# Patient Record
Sex: Male | Born: 1965 | Race: Black or African American | Hispanic: No | Marital: Married | Smoking: Never smoker
Health system: Southern US, Community
[De-identification: ages and names within clinical notes are randomized; demographics above are authoritative.]

## PROBLEM LIST (undated history)

## (undated) DIAGNOSIS — E119 Type 2 diabetes mellitus without complications: Secondary | ICD-10-CM

## (undated) DIAGNOSIS — I10 Essential (primary) hypertension: Secondary | ICD-10-CM

## (undated) HISTORY — PX: VENTRAL HERNIA REPAIR: SHX424

---

## 2016-08-03 ENCOUNTER — Ambulatory Visit: Payer: Self-pay

## 2016-08-03 ENCOUNTER — Other Ambulatory Visit: Payer: Self-pay | Admitting: Occupational Medicine

## 2016-08-03 DIAGNOSIS — Z0000 Encounter for general adult medical examination without abnormal findings: Secondary | ICD-10-CM

## 2017-04-12 ENCOUNTER — Emergency Department (HOSPITAL_BASED_OUTPATIENT_CLINIC_OR_DEPARTMENT_OTHER): Payer: BLUE CROSS/BLUE SHIELD

## 2017-04-12 ENCOUNTER — Encounter (HOSPITAL_BASED_OUTPATIENT_CLINIC_OR_DEPARTMENT_OTHER): Payer: Self-pay | Admitting: *Deleted

## 2017-04-12 ENCOUNTER — Emergency Department (HOSPITAL_BASED_OUTPATIENT_CLINIC_OR_DEPARTMENT_OTHER)
Admission: EM | Admit: 2017-04-12 | Discharge: 2017-04-12 | Disposition: A | Discharge status: Home / Self Care | Payer: BLUE CROSS/BLUE SHIELD | Attending: Emergency Medicine | Admitting: Emergency Medicine

## 2017-04-12 ENCOUNTER — Other Ambulatory Visit: Payer: Self-pay

## 2017-04-12 DIAGNOSIS — Y9389 Activity, other specified: Secondary | ICD-10-CM | POA: Insufficient documentation

## 2017-04-12 DIAGNOSIS — I10 Essential (primary) hypertension: Secondary | ICD-10-CM | POA: Insufficient documentation

## 2017-04-12 DIAGNOSIS — S61402A Unspecified open wound of left hand, initial encounter: Secondary | ICD-10-CM | POA: Diagnosis not present

## 2017-04-12 DIAGNOSIS — Y92009 Unspecified place in unspecified non-institutional (private) residence as the place of occurrence of the external cause: Secondary | ICD-10-CM | POA: Diagnosis not present

## 2017-04-12 DIAGNOSIS — E119 Type 2 diabetes mellitus without complications: Secondary | ICD-10-CM | POA: Diagnosis not present

## 2017-04-12 DIAGNOSIS — W3400XA Accidental discharge from unspecified firearms or gun, initial encounter: Secondary | ICD-10-CM | POA: Diagnosis not present

## 2017-04-12 DIAGNOSIS — Y999 Unspecified external cause status: Secondary | ICD-10-CM | POA: Insufficient documentation

## 2017-04-12 DIAGNOSIS — S61412A Laceration without foreign body of left hand, initial encounter: Secondary | ICD-10-CM

## 2017-04-12 HISTORY — DX: Essential (primary) hypertension: I10

## 2017-04-12 HISTORY — DX: Type 2 diabetes mellitus without complications: E11.9

## 2017-04-12 MED ORDER — MORPHINE SULFATE (PF) 4 MG/ML IV SOLN
4.0000 mg | Freq: Once | INTRAVENOUS | Status: AC
  Administered 2017-04-12: 4 mg via INTRAVENOUS
  Filled 2017-04-12: qty 1

## 2017-04-12 MED ORDER — MORPHINE SULFATE (PF) 4 MG/ML IV SOLN
6.0000 mg | Freq: Once | INTRAVENOUS | Status: AC
  Administered 2017-04-12: 6 mg via INTRAVENOUS
  Filled 2017-04-12: qty 2

## 2017-04-12 MED ORDER — LIDOCAINE-EPINEPHRINE (PF) 2 %-1:200000 IJ SOLN
20.0000 mL | Freq: Once | INTRAMUSCULAR | Status: AC
  Administered 2017-04-12: 20 mL
  Filled 2017-04-12: qty 20

## 2017-04-12 MED ORDER — LIDOCAINE-EPINEPHRINE 2 %-1:100000 IJ SOLN
INTRAMUSCULAR | Status: AC
  Filled 2017-04-12: qty 1

## 2017-04-12 MED ORDER — HYDROCODONE-ACETAMINOPHEN 5-325 MG PO TABS: 1.0000 | ORAL_TABLET | ORAL | 0 refills | Status: DC | PRN

## 2017-04-12 MED ORDER — CEFAZOLIN SODIUM-DEXTROSE 1-4 GM/50ML-% IV SOLN
1.0000 g | Freq: Once | INTRAVENOUS | Status: AC
  Administered 2017-04-12: 1 g via INTRAVENOUS
  Filled 2017-04-12: qty 50

## 2017-04-12 MED ORDER — ONDANSETRON HCL 4 MG/2ML IJ SOLN
4.0000 mg | Freq: Once | INTRAMUSCULAR | Status: AC
  Administered 2017-04-12: 4 mg via INTRAVENOUS
  Filled 2017-04-12: qty 2

## 2017-04-12 MED ORDER — CEPHALEXIN 500 MG PO CAPS: 500.0000 mg | ORAL_CAPSULE | Freq: Four times a day (QID) | ORAL | 0 refills | Status: AC

## 2017-04-12 MED ORDER — HYDROCODONE-ACETAMINOPHEN 5-325 MG PO TABS
1.0000 | ORAL_TABLET | Freq: Once | ORAL | Status: AC
  Administered 2017-04-12: 1 via ORAL
  Filled 2017-04-12: qty 1

## 2017-04-12 NOTE — ED Notes (Signed)
Bacitracin applied to wound with non-adhesive dressing and wrapped in kerlex.

## 2017-04-12 NOTE — Discharge Instructions (Addendum)
If you are unable to be seen in the hand doctors office in the next two days please follow up with your primary care doctor for a wound check.  If needed you may return to the er for wound check.  Please return sooner if you have any concerns.   Please take Ibuprofen (Advil, motrin) and Tylenol (acetaminophen) to relieve your pain.  You may take up to 600 MG (3 pills) of normal strength ibuprofen every 8 hours as needed.  In between doses of ibuprofen you make take tylenol, up to 1,000 mg (two extra strength pills).  Do not take more than 3,000 mg tylenol in a 24 hour period.  Please check all medication labels as many medications such as pain and cold medications may contain tylenol.  Do not drink alcohol while taking these medications.  Do not take other NSAID'S while taking ibuprofen (such as aleve or naproxen).  Please take ibuprofen with food to decrease stomach upset.  You may have diarrhea from the antibiotics.  It is very important that you continue to take the antibiotics even if you get diarrhea unless a medical professional tells you that you may stop taking them.  If you stop too early the bacteria you are being treated for will become stronger and you may need different, more powerful antibiotics that have more side effects and worsening diarrhea.  Please stay well hydrated and consider probiotics as they may decrease the severity of your diarrhea.   Today you received medications that may make you sleepy or impair your ability to make decisions.  For the next 24 hours please do not drive, operate heavy machinery, care for a small child with out another adult present, or perform any activities that may cause harm to you or someone else if you were to fall asleep or be impaired.   You are being prescribed a medication which may make you sleepy. Please follow up of listed precautions for at least 24 hours after taking one dose.

## 2017-04-12 NOTE — ED Notes (Signed)
EDP at bedside of Pt. Injecting lidocaine for procedure.
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
## 2017-04-12 NOTE — ED Notes (Signed)
D/c instructions given at length to pt. Voiced understanding.

## 2017-04-12 NOTE — ED Notes (Signed)
ED Provider at bedside. 
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
## 2017-10-13 ENCOUNTER — Ambulatory Visit: Payer: Self-pay

## 2017-10-13 ENCOUNTER — Other Ambulatory Visit: Payer: Self-pay | Admitting: Occupational Medicine

## 2017-10-13 DIAGNOSIS — Z0000 Encounter for general adult medical examination without abnormal findings: Secondary | ICD-10-CM

## 2020-04-28 IMAGING — DX DG CHEST 1V
1 series · 1 of 1 positions shown · non-contrast
Comparison: 08/03/2016

CLINICAL DATA: Physical exam.

EXAM:
CHEST  1 VIEW

[chest pa]
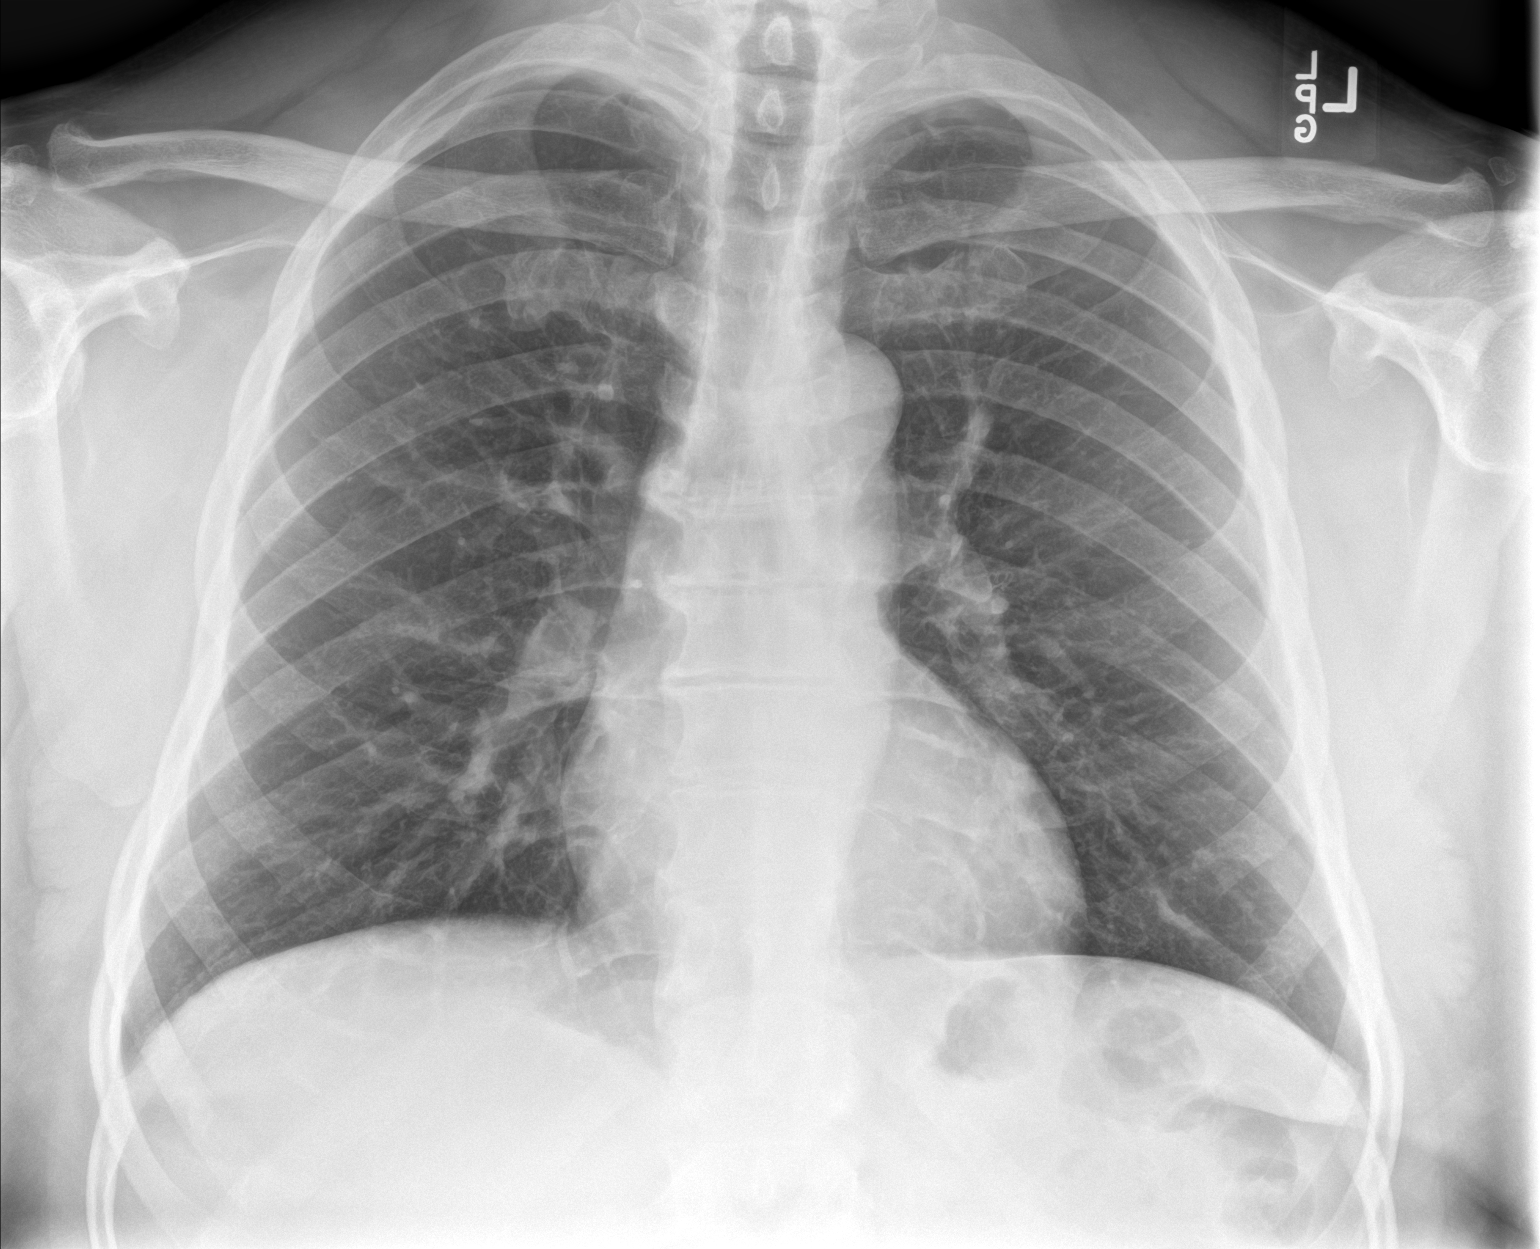

[1 of 1 positions shown; findings below may reference images not displayed]

FINDINGS: Heart and mediastinal contours are within normal limits. No focal
opacities or effusions. No acute bony abnormality.
IMPRESSION: No active cardiopulmonary disease.

## 2023-02-02 ENCOUNTER — Ambulatory Visit (INDEPENDENT_AMBULATORY_CARE_PROVIDER_SITE_OTHER): Payer: Self-pay | Admitting: Urology

## 2023-02-02 ENCOUNTER — Encounter: Payer: Self-pay | Admitting: Urology

## 2023-02-02 VITALS — BP 137/87 | HR 57 | Ht 74.0 in | Wt 245.0 lb

## 2023-02-02 DIAGNOSIS — N481 Balanitis: Secondary | ICD-10-CM | POA: Diagnosis not present

## 2023-02-02 MED ORDER — CLOTRIMAZOLE-BETAMETHASONE 1-0.05 % EX CREA: 1.0000 | TOPICAL_CREAM | Freq: Two times a day (BID) | CUTANEOUS | 1 refills | Status: DC

## 2023-02-02 NOTE — Progress Notes (Signed)
   Assessment: 1. Balanitis      Plan: Rx lotrisone  cr Recommended local care measures Recommended VED constriction bands and ordering info provided  Chief Complaint: balanitis  History of Present Illness:  Anthony Mcgrath is a 57 y.o. male who is seen today as a new patient complaining of some penile skin irritation.  On exam he has mild to moderate balanitis involving the prepuce.  It fully retracts.  Patient also uses successfully penile constriction band to maintain erection- has used for may years.  It is metalic.  I recommended that he use elastic medical ved bands.  Past Medical History:  Past Medical History:  Diagnosis Date   Diabetes mellitus without complication (HCC)    Hypertension     Past Surgical History:  No past surgical history on file.  Allergies:  No Known Allergies  Family History:  No family history on file.  Social History:  Social History   Tobacco Use   Smoking status: Never   Smokeless tobacco: Never    Review of symptoms:  Constitutional:  Negative for unexplained weight loss, night sweats, fever, chills ENT:  Negative for nose bleeds, sinus pain, painful swallowing CV:  Negative for chest pain, shortness of breath, exercise intolerance, palpitations, loss of consciousness Resp:  Negative for cough, wheezing, shortness of breath GI:  Negative for nausea, vomiting, diarrhea, bloody stools GU:  Positives noted in HPI; otherwise negative for gross hematuria, dysuria, urinary incontinence Neuro:  Negative for seizures, poor balance, limb weakness, slurred speech Psych:  Negative for lack of energy, depression, anxiety Endocrine:  Negative for polydipsia, polyuria, symptoms of hypoglycemia (dizziness, hunger, sweating) Hematologic:  Negative for anemia, purpura, petechia, prolonged or excessive bleeding, use of anticoagulants  Allergic:  Negative for difficulty breathing or choking as a result of exposure to anything; no shellfish allergy; no  allergic response (rash/itch) to materials, foods  Physical exam: BP 137/87   Pulse (!) 57   Ht 6' 2 (1.88 m)   Wt 245 lb (111.1 kg)   BMI 31.46 kg/m  GENERAL APPEARANCE:  Well appearing, well developed, well nourished, NAD  GU:  uncirc phallus with prominent circumferential balanitis

## 2023-03-30 ENCOUNTER — Emergency Department (HOSPITAL_BASED_OUTPATIENT_CLINIC_OR_DEPARTMENT_OTHER): Payer: No Typology Code available for payment source

## 2023-03-30 ENCOUNTER — Other Ambulatory Visit: Payer: Self-pay

## 2023-03-30 ENCOUNTER — Inpatient Hospital Stay (HOSPITAL_BASED_OUTPATIENT_CLINIC_OR_DEPARTMENT_OTHER)
Admission: EM | Admit: 2023-03-30 | Discharge: 2023-04-02 | DRG: 389 | Disposition: A | Discharge status: Home / Self Care | Payer: No Typology Code available for payment source | Attending: Internal Medicine | Admitting: Internal Medicine

## 2023-03-30 ENCOUNTER — Encounter (HOSPITAL_BASED_OUTPATIENT_CLINIC_OR_DEPARTMENT_OTHER): Payer: Self-pay | Admitting: Emergency Medicine

## 2023-03-30 DIAGNOSIS — E119 Type 2 diabetes mellitus without complications: Secondary | ICD-10-CM | POA: Diagnosis not present

## 2023-03-30 DIAGNOSIS — R030 Elevated blood-pressure reading, without diagnosis of hypertension: Secondary | ICD-10-CM | POA: Diagnosis present

## 2023-03-30 DIAGNOSIS — T383X6A Underdosing of insulin and oral hypoglycemic [antidiabetic] drugs, initial encounter: Secondary | ICD-10-CM | POA: Diagnosis present

## 2023-03-30 DIAGNOSIS — Z91148 Patient's other noncompliance with medication regimen for other reason: Secondary | ICD-10-CM

## 2023-03-30 DIAGNOSIS — K56600 Partial intestinal obstruction, unspecified as to cause: Secondary | ICD-10-CM | POA: Diagnosis present

## 2023-03-30 DIAGNOSIS — N1831 Chronic kidney disease, stage 3a: Secondary | ICD-10-CM | POA: Diagnosis present

## 2023-03-30 DIAGNOSIS — I10 Essential (primary) hypertension: Secondary | ICD-10-CM | POA: Diagnosis present

## 2023-03-30 DIAGNOSIS — E1165 Type 2 diabetes mellitus with hyperglycemia: Secondary | ICD-10-CM | POA: Diagnosis present

## 2023-03-30 DIAGNOSIS — K56609 Unspecified intestinal obstruction, unspecified as to partial versus complete obstruction: Principal | ICD-10-CM | POA: Diagnosis present

## 2023-03-30 DIAGNOSIS — E1122 Type 2 diabetes mellitus with diabetic chronic kidney disease: Secondary | ICD-10-CM | POA: Diagnosis present

## 2023-03-30 DIAGNOSIS — R112 Nausea with vomiting, unspecified: Secondary | ICD-10-CM | POA: Diagnosis present

## 2023-03-30 DIAGNOSIS — R0602 Shortness of breath: Secondary | ICD-10-CM | POA: Insufficient documentation

## 2023-03-30 DIAGNOSIS — R109 Unspecified abdominal pain: Secondary | ICD-10-CM | POA: Diagnosis present

## 2023-03-30 DIAGNOSIS — R1084 Generalized abdominal pain: Secondary | ICD-10-CM | POA: Diagnosis not present

## 2023-03-30 DIAGNOSIS — N179 Acute kidney failure, unspecified: Secondary | ICD-10-CM | POA: Diagnosis not present

## 2023-03-30 LAB — COMPREHENSIVE METABOLIC PANEL
ALT: 33 U/L (ref 0–44)
AST: 29 U/L (ref 15–41)
Albumin: 4.3 g/dL (ref 3.5–5.0)
Alkaline Phosphatase: 62 U/L (ref 38–126)
Anion gap: 9 (ref 5–15)
BUN: 18 mg/dL (ref 6–20)
CO2: 27 mmol/L (ref 22–32)
Calcium: 9.6 mg/dL (ref 8.9–10.3)
Chloride: 98 mmol/L (ref 98–111)
Creatinine, Ser: 0.97 mg/dL (ref 0.61–1.24)
GFR, Estimated: >60 mL/min (ref >60)
Glucose, Bld: 176 mg/dL — ABNORMAL HIGH (ref 70–99)
Potassium: 4.7 mmol/L (ref 3.5–5.1)
Sodium: 134 mmol/L — ABNORMAL LOW (ref 135–145)
Total Bilirubin: 1.4 mg/dL — ABNORMAL HIGH (ref 0.0–1.2)
Total Protein: 7.4 g/dL (ref 6.5–8.1)

## 2023-03-30 LAB — CBC
HCT: 44.2 % (ref 39.0–52.0)
Hemoglobin: 14.2 g/dL (ref 13.0–17.0)
MCH: 28.6 pg (ref 26.0–34.0)
MCHC: 32.1 g/dL (ref 30.0–36.0)
MCV: 88.9 fL (ref 80.0–100.0)
Platelets: 205 10*3/uL (ref 150–400)
RBC: 4.97 MIL/uL (ref 4.22–5.81)
RDW: 13.4 % (ref 11.5–15.5)
WBC: 7 10*3/uL (ref 4.0–10.5)
nRBC: 0 % (ref 0.0–0.2)

## 2023-03-30 LAB — GLUCOSE, CAPILLARY: Glucose-Capillary: 122 mg/dL — ABNORMAL HIGH (ref 70–99)

## 2023-03-30 LAB — HEMOGLOBIN A1C
Hgb A1c MFr Bld: 10.5 % — ABNORMAL HIGH (ref 4.8–5.6)
Mean Plasma Glucose: 254.65 mg/dL

## 2023-03-30 LAB — LIPASE, BLOOD: Lipase: 30 U/L (ref 11–51)

## 2023-03-30 MED ORDER — HYDROMORPHONE HCL 1 MG/ML IJ SOLN
0.5000 mg | INTRAMUSCULAR | Status: DC | PRN
  Administered 2023-03-30 – 2023-03-31 (×2): 0.5 mg via INTRAVENOUS
  Filled 2023-03-30 (×2): qty 0.5

## 2023-03-30 MED ORDER — ONDANSETRON HCL 4 MG/2ML IJ SOLN
4.0000 mg | Freq: Once | INTRAMUSCULAR | Status: AC
  Administered 2023-03-30: 4 mg via INTRAVENOUS
  Filled 2023-03-30: qty 2

## 2023-03-30 MED ORDER — ACETAMINOPHEN 325 MG PO TABS: 650.0000 mg | ORAL_TABLET | Freq: Four times a day (QID) | ORAL | Status: DC | PRN

## 2023-03-30 MED ORDER — SODIUM CHLORIDE 0.9 % IV SOLN
INTRAVENOUS | Status: AC
Start: 2023-03-30 — End: 2023-03-31

## 2023-03-30 MED ORDER — ONDANSETRON HCL 4 MG/2ML IJ SOLN: 4.0000 mg | Freq: Four times a day (QID) | INTRAMUSCULAR | Status: DC | PRN

## 2023-03-30 MED ORDER — KETOROLAC TROMETHAMINE 30 MG/ML IJ SOLN
30.0000 mg | Freq: Once | INTRAMUSCULAR | Status: AC
  Administered 2023-03-30: 30 mg via INTRAVENOUS
  Filled 2023-03-30: qty 1

## 2023-03-30 MED ORDER — IOHEXOL 300 MG/ML  SOLN
125.0000 mL | Freq: Once | INTRAMUSCULAR | Status: AC | PRN
  Administered 2023-03-30: 125 mL via INTRAVENOUS

## 2023-03-30 MED ORDER — ACETAMINOPHEN 650 MG RE SUPP: 650.0000 mg | Freq: Four times a day (QID) | RECTAL | Status: DC | PRN

## 2023-03-30 MED ORDER — INSULIN ASPART 100 UNIT/ML IJ SOLN
0.0000 [IU] | INTRAMUSCULAR | Status: DC
  Administered 2023-03-31: 2 [IU] via SUBCUTANEOUS
  Administered 2023-03-31 (×2): 1 [IU] via SUBCUTANEOUS

## 2023-03-30 MED ORDER — HYDRALAZINE HCL 20 MG/ML IJ SOLN: 5.0000 mg | INTRAMUSCULAR | Status: DC | PRN

## 2023-03-30 NOTE — ED Triage Notes (Signed)
 States saw abd pain since 4 am  went to see PCP , had xrays and told he has stool in bowels ,,

## 2023-03-30 NOTE — ED Notes (Signed)
 [facility] called for transport, No Current ETA ED Nurse aware  Called @ 20:49

## 2023-03-30 NOTE — ED Provider Notes (Signed)
 [facility] Provider Note   CSN: [id number] Arrival date & time: 03/30/23  1443     History  Chief Complaint  Patient presents with   Abdominal Pain    Detrell Umscheid is a 58 y.o. male with hx of diabetes, HTN, who presents to the ER complaining of abdominal pain since 4 am this morning. Pt states he went to his PCP and had an x-ray done which showed his bowels were full of stool. Pt has hx of ventral hernia repair x 2 and that is where his pain is localized today. Has not felt any bulging. Pain does not radiate. Has vomited about 7 times today. Has had 3 loose BM's. No urinary sx.    Abdominal Pain Associated symptoms: nausea and vomiting        Home Medications Prior to Admission medications   Medication Sig Start Date End Date Taking? Authorizing Provider  clotrimazole-betamethasone (LOTRISONE) cream Apply 1 Application topically 2 (two) times daily. 02/02/23   Joline Maxcy, MD      Allergies    Patient has no known allergies.    Review of Systems   Review of Systems  Gastrointestinal:  Positive for abdominal pain, nausea and vomiting.  All other systems reviewed and are negative.   Physical Exam Updated Vital Signs BP (!) 136/93   Pulse (!) 58   Temp 98.9 F (37.2 C) (Oral)   Resp 18   Ht 6\' 2"  (1.88 m)   Wt 104.3 kg   SpO2 98%   BMI 29.53 kg/m  Physical Exam Vitals and nursing note reviewed.  Constitutional:      Appearance: Normal appearance.  HENT:     Head: Normocephalic and atraumatic.  Eyes:     Conjunctiva/sclera: Conjunctivae normal.  Cardiovascular:     Rate and Rhythm: Normal rate and regular rhythm.  Pulmonary:     Effort: Pulmonary effort is normal. No respiratory distress.     Breath sounds: Normal breath sounds.  Abdominal:     General: There is no distension.     Palpations: Abdomen is soft.     Tenderness: There is abdominal tenderness.       Comments: Surgical scar from ventral  hernia repair, no palpable hernia, tenderness without significant guarding  Skin:    General: Skin is warm and dry.  Neurological:     General: No focal deficit present.     Mental Status: He is alert.     ED Results / Procedures / Treatments   Labs (all labs ordered are listed, but only abnormal results are displayed) Labs Reviewed  COMPREHENSIVE METABOLIC PANEL - Abnormal; Notable for the following components:      Result Value   Sodium 134 (*)    Glucose, Bld 176 (*)    Total Bilirubin 1.4 (*)    All other components within normal limits  LIPASE, BLOOD  CBC  URINALYSIS, ROUTINE W REFLEX MICROSCOPIC  HEMOGLOBIN A1C  CBC WITH DIFFERENTIAL/PLATELET  BASIC METABOLIC PANEL    EKG None  Radiology CT ABDOMEN PELVIS W CONTRAST Result Date: 03/30/2023 CLINICAL DATA:  Epigastric pain. Previous ventral hernia repair. Constipation. EXAM: CT ABDOMEN AND PELVIS WITH CONTRAST TECHNIQUE: Multidetector CT imaging of the abdomen and pelvis was performed using the standard protocol following bolus administration of intravenous contrast. RADIATION DOSE REDUCTION: This exam was performed according to the departmental dose-optimization program which includes automated exposure control, adjustment of the mA and/or kV according to patient size  and/or use of iterative reconstruction technique. CONTRAST:  OMNIPAQUE IOHEXOL 300 MG/ML  SOLN COMPARISON:  None Available. FINDINGS: Lower chest: There is some linear opacity lung bases likely scar or atelectasis. No pleural effusion. Hepatobiliary: Mild fatty liver infiltration. No space-occupying liver lesion. Gallbladder is present. Patent portal vein. Pancreas: Unremarkable. No pancreatic ductal dilatation or surrounding inflammatory changes. Spleen: Normal in size without focal abnormality. Adrenals/Urinary Tract: Right adrenal gland is preserved. Left adrenal gland is slightly nodular. Focal area has diameter 16 mm with Hounsfield unit 85 on portal  venous phase and 50 on delay, not clearly an adenoma. No enhancing renal mass or collecting system dilatation. The ureters have normal course and caliber extending down to the urinary bladder. Bladder has a preserved contour. Stomach/Bowel: Large bowel has a normal course and caliber with scattered stool. Scattered diffuse colonic diverticula as well. Normal appendix extends inferior to the cecum in the right lower quadrant. Stomach is distended with fluid and some debris. The duodenum is nondilated. There are some dilated loops of small bowel however in the mid abdomen and upper pelvis. Diameter approaches 4.5 cm. There is adjacent stranding. Small bowel stool appearance identified as well with a transition point seen along the anterior mid abdomen near the level of the umbilicus and the mesh for the previous hernia repair. Please see axial series 301, image 42, coronal series 601, image 123 and sagittal series 602, image 91. Mild soft tissue thickening in this location as well along the anterior abdominal wall. No pneumatosis. No definite free air or portal venous gas. Vascular/Lymphatic: Normal caliber aorta and IVC. Mild scattered vascular calcifications. No discrete abnormal lymph node enlargement present in the abdomen and pelvis. Reproductive: Prostate is unremarkable. Other: Trace free fluid the pelvis. Is also some fluid adjacent to the liver laterally and towards the dome. Musculoskeletal: Scattered degenerative changes of the spine and pelvis. Transitional lumbosacral segment. Areas of significant stenosis along the lower lumbar spine. IMPRESSION: Moderate dilatation of small bowel with areas of adjacent mesenteric fat stranding and small bowel stool appearance. Transition point along the anterior central abdomen near the level of the mesh for prior hernia repair. Developing or partial obstruction. Trace free fluid. No free air Fatty liver infiltration.  Scattered colonic diverticula. Left adrenal nodules  indeterminate. Please correlate with any prior to assess for long-term stability or additional confirmatory workup when appropriate with either MRI or washout pre and postcontrast CT Electronically Signed   By: Karen Kays M.D.   On: 03/30/2023 18:25    Procedures Procedures    Medications Ordered in ED Medications  ondansetron (ZOFRAN) injection 4 mg (has no administration in time range)  HYDROmorphone (DILAUDID) injection 0.5 mg (has no administration in time range)  0.9 %  sodium chloride infusion (has no administration in time range)  insulin aspart (novoLOG) injection 0-9 Units (has no administration in time range)  hydrALAZINE (APRESOLINE) injection 5 mg (has no administration in time range)  ketorolac (TORADOL) 30 MG/ML injection 30 mg (30 mg Intravenous Given 03/30/23 1619)  ondansetron (ZOFRAN) injection 4 mg (4 mg Intravenous Given 03/30/23 1618)  iohexol (OMNIPAQUE) 300 MG/ML solution 125 mL (125 mLs Intravenous Contrast Given 03/30/23 1749)    ED Course/ Medical Decision Making/ A&P Clinical Course as of 03/30/23 2011  Tue Mar 30, 2023  5332 58 year old male here with nausea vomiting abdominal pain.  Abdomen generally tender.  CT showing possible SBO.  Patient had NG placed and will be admitted for further management. [MB]  Clinical Course User Index [MB] Terrilee Files, MD                                 Medical Decision Making Amount and/or Complexity of Data Reviewed Labs: ordered. Radiology: ordered.  Risk Prescription drug management. Decision regarding hospitalization.   This patient is a 58 y.o. male  who presents to the ED for concern of abdominal pain and vomiting.   Differential diagnoses prior to evaluation: The emergent differential diagnosis includes, but is not limited to,  AAA, mesenteric ischemia, appendicitis, diverticulitis, DKA, gastroenteritis, nephrolithiasis, pancreatitis, constipation, UTI, bowel obstruction, biliary disease, IBD, PUD,  hepatitis. This is not an exhaustive differential.   Past Medical History / Co-morbidities / Social History: HTN, diabetes  Additional history: Chart reviewed. Pertinent results include: Recurrent ventral hernia, most recently repaired in 2017 in [facility]  Physical Exam: Physical exam performed. The pertinent findings include: Stable vitals, no acute distress. Abdomen soft with tenderness around ventral hernia scar, no palpable hernia.   Lab Tests/Imaging studies: I personally interpreted labs/imaging and the pertinent results include:  CBC and CMP grossly unremarkable, glucose elevated, bilirubin 1.4. Normal lipase.   CT abd/pelvis shows dilated small bowel, adjacent stranding, transition point along anterior mid abdomen at level of mesh from prior hernia repair, consistent with partial or developing SBO. I agree with the radiologist interpretation.  NG tube placed, confirmed on XR.   Medications: I ordered medication including toradol, zofran.  I have reviewed the patients home medicines and have made adjustments as needed.  Consultations obtained: I consulted with general surgeon Dr Dwain Sarna who recommended: medical admission, surgery to follow as needed  I consulted with hospitalist Dr Joneen Roach who will admit.    Disposition: After consideration of the diagnostic results and the patients response to treatment, I feel that patient would benefit from admission for management of small bowel obstruction.   Final Clinical Impression(s) / ED Diagnoses Final diagnoses:  Small bowel obstruction (HCC)    Rx / DC Orders ED Discharge Orders     None      Portions of this report may have been transcribed using voice recognition software. Every effort was made to ensure accuracy; however, inadvertent computerized transcription errors may be present.    Jeanella Flattery 03/30/23 2011    Terrilee Files, MD 03/31/23 1017

## 2023-03-30 NOTE — H&P (Signed)
 History and Physical      Anthony Mcgrath UXL:[id number] DOB: Dec 18, 1965 DOA: 03/30/2023; DOS: 03/30/2023  PCP: Patient, No Pcp Per *** Patient coming from: home ***  I have personally briefly reviewed patient's old medical records in [facility]  Chief Complaint: ***  HPI: Anthony Mcgrath is a 58 y.o. male with medical history significant for *** who is admitted to [facility] on 03/30/2023 with *** after presenting from home*** to [facility] ED complaining of ***.    ***       ***   ED Course:  Vital signs in the ED were notable for the following: ***  Labs were notable for the following: ***  Per my interpretation, EKG in ED demonstrated the following:  ***  Imaging in the ED, per corresponding formal radiology read, was notable for the following:  ***  While in the ED, the following were administered: ***  Subsequently, the patient was admitted  ***  ***red    Review of Systems: As per HPI otherwise 10 point review of systems negative.   Past Medical History:  Diagnosis Date   Diabetes mellitus without complication (HCC)    Hypertension     History reviewed. No pertinent surgical history.  Social History:  reports that he has never smoked. He has never been exposed to tobacco smoke. He has never used smokeless tobacco. He reports that he does not drink alcohol and does not use drugs.   No Known Allergies  History reviewed. No pertinent family history.  Family history reviewed and not pertinent ***   Prior to Admission medications   Medication Sig Start Date End Date Taking? Authorizing Provider  clotrimazole-betamethasone (LOTRISONE) cream Apply 1 Application topically 2 (two) times daily. 02/02/23   Joline Maxcy, MD     Objective    Physical Exam: Vitals:   03/30/23 1930 03/30/23 2000 03/30/23 2052 03/30/23 2221  BP: (!) 131/94 (!) 141/90  (!) 148/94  Pulse: 62 (!) 58  (!) 56  Resp:    18  Temp:   97.7 F (36.5 C) 97.9 F (36.6 C)   TempSrc:   Oral Oral  SpO2: 94% 98%  100%  Weight:      Height:        General: appears to be stated age; alert, oriented Skin: warm, dry, no rash Head:  AT/[place] Mouth:  Oral mucosa membranes appear moist, normal dentition Neck: supple; trachea midline Heart:  RRR; did not appreciate any M/R/G Lungs: CTAB, did not appreciate any wheezes, rales, or rhonchi Abdomen: + BS; soft, ND, NT Vascular: 2+ pedal pulses b/l; 2+ radial pulses b/l Extremities: no peripheral edema, no muscle wasting Neuro: strength and sensation intact in upper and lower extremities b/l ***   *** Neuro: 5/5 strength of the proximal and distal flexors and extensors of the upper and lower extremities bilaterally; sensation intact in upper and lower extremities b/l; cranial nerves II through XII grossly intact; no pronator drift; no evidence suggestive of slurred speech, dysarthria, or facial droop; Normal muscle tone. No tremors.  *** Neuro: In the setting of the patient's current mental status and associated inability to follow instructions, unable to perform full neurologic exam at this time.  As such, assessment of strength, sensation, and cranial nerves is limited at this time. Patient noted to spontaneously move all 4 extremities. No tremors.  ***    Labs on Admission: I have personally reviewed following labs and imaging studies  CBC: Recent Labs  Lab 03/30/23  1456  WBC 7.0  HGB 14.2  HCT 44.2  MCV 88.9  PLT 205   Basic Metabolic Panel: Recent Labs  Lab 03/30/23 1456  NA 134*  K 4.7  CL 98  CO2 27  GLUCOSE 176*  BUN 18  CREATININE 0.97  CALCIUM 9.6   GFR: Estimated Creatinine Clearance: 105.5 mL/min (by C-G formula based on SCr of 0.97 mg/dL). Liver Function Tests: Recent Labs  Lab 03/30/23 1456  AST 29  ALT 33  ALKPHOS 62  BILITOT 1.4*  PROT 7.4  ALBUMIN 4.3   Recent Labs  Lab 03/30/23 1456  LIPASE 30   No results for input(s): "AMMONIA" in the last 168  hours. Coagulation Profile: No results for input(s): "INR", "PROTIME" in the last 168 hours. Cardiac Enzymes: No results for input(s): "CKTOTAL", "CKMB", "CKMBINDEX", "TROPONINI" in the last 168 hours. BNP (last 3 results) No results for input(s): "PROBNP" in the last 8760 hours. HbA1C: Recent Labs    03/30/23 2030  HGBA1C 10.5*   CBG: Recent Labs  Lab 03/30/23 2226  GLUCAP 122*   Lipid Profile: No results for input(s): "CHOL", "HDL", "LDLCALC", "TRIG", "CHOLHDL", "LDLDIRECT" in the last 72 hours. Thyroid Function Tests: No results for input(s): "TSH", "T4TOTAL", "FREET4", "T3FREE", "THYROIDAB" in the last 72 hours. Anemia Panel: No results for input(s): "VITAMINB12", "FOLATE", "FERRITIN", "TIBC", "IRON", "RETICCTPCT" in the last 72 hours. Urine analysis: No results found for: "COLORURINE", "APPEARANCEUR", "LABSPEC", "PHURINE", "GLUCOSEU", "HGBUR", "BILIRUBINUR", "KETONESUR", "PROTEINUR", "UROBILINOGEN", "NITRITE", "LEUKOCYTESUR"  Radiological Exams on Admission: DG Abdomen 1 View Result Date: 03/30/2023 CLINICAL DATA:  NG tube placement EXAM: ABDOMEN - 1 VIEW COMPARISON:  10/13/2017 FINDINGS: Limited field of view for tube placement verification purposes. An enteric tube is present with tip coiled in the left upper quadrant consistent with location in the body of the stomach. Visualized bowel gas pattern is normal. Lung bases are clear. Residual contrast material in the renal collecting systems. IMPRESSION: Enteric tube tip projects over the left upper quadrant consistent with location in the body of the stomach. Electronically Signed   By: Burman Nieves M.D.   On: 03/30/2023 20:16   CT ABDOMEN PELVIS W CONTRAST Result Date: 03/30/2023 CLINICAL DATA:  Epigastric pain. Previous ventral hernia repair. Constipation. EXAM: CT ABDOMEN AND PELVIS WITH CONTRAST TECHNIQUE: Multidetector CT imaging of the abdomen and pelvis was performed using the standard protocol following bolus  administration of intravenous contrast. RADIATION DOSE REDUCTION: This exam was performed according to the departmental dose-optimization program which includes automated exposure control, adjustment of the mA and/or kV according to patient size and/or use of iterative reconstruction technique. CONTRAST:  OMNIPAQUE IOHEXOL 300 MG/ML  SOLN COMPARISON:  None Available. FINDINGS: Lower chest: There is some linear opacity lung bases likely scar or atelectasis. No pleural effusion. Hepatobiliary: Mild fatty liver infiltration. No space-occupying liver lesion. Gallbladder is present. Patent portal vein. Pancreas: Unremarkable. No pancreatic ductal dilatation or surrounding inflammatory changes. Spleen: Normal in size without focal abnormality. Adrenals/Urinary Tract: Right adrenal gland is preserved. Left adrenal gland is slightly nodular. Focal area has diameter 16 mm with Hounsfield unit 85 on portal venous phase and 50 on delay, not clearly an adenoma. No enhancing renal mass or collecting system dilatation. The ureters have normal course and caliber extending down to the urinary bladder. Bladder has a preserved contour. Stomach/Bowel: Large bowel has a normal course and caliber with scattered stool. Scattered diffuse colonic diverticula as well. Normal appendix extends inferior to the cecum in the right lower quadrant.  Stomach is distended with fluid and some debris. The duodenum is nondilated. There are some dilated loops of small bowel however in the mid abdomen and upper pelvis. Diameter approaches 4.5 cm. There is adjacent stranding. Small bowel stool appearance identified as well with a transition point seen along the anterior mid abdomen near the level of the umbilicus and the mesh for the previous hernia repair. Please see axial series 301, image 42, coronal series 601, image 123 and sagittal series 602, image 91. Mild soft tissue thickening in this location as well along the anterior abdominal wall. No  pneumatosis. No definite free air or portal venous gas. Vascular/Lymphatic: Normal caliber aorta and IVC. Mild scattered vascular calcifications. No discrete abnormal lymph node enlargement present in the abdomen and pelvis. Reproductive: Prostate is unremarkable. Other: Trace free fluid the pelvis. Is also some fluid adjacent to the liver laterally and towards the dome. Musculoskeletal: Scattered degenerative changes of the spine and pelvis. Transitional lumbosacral segment. Areas of significant stenosis along the lower lumbar spine. IMPRESSION: Moderate dilatation of small bowel with areas of adjacent mesenteric fat stranding and small bowel stool appearance. Transition point along the anterior central abdomen near the level of the mesh for prior hernia repair. Developing or partial obstruction. Trace free fluid. No free air Fatty liver infiltration.  Scattered colonic diverticula. Left adrenal nodules indeterminate. Please correlate with any prior to assess for long-term stability or additional confirmatory workup when appropriate with either MRI or washout pre and postcontrast CT Electronically Signed   By: Karen Kays M.D.   On: 03/30/2023 18:25      Assessment/Plan   Principal Problem:   SOB (shortness of breath)   ***            ***                ***                 ***               ***               ***               ***                ***               ***               ***               ***               ***              ***          ***  DVT prophylaxis: SCD's ***  Code Status: Full code*** Family Communication: none*** Disposition Plan: Per Rounding Team Consults called: none***;  Admission status: ***     I SPENT GREATER THAN 75 *** MINUTES IN CLINICAL CARE TIME/MEDICAL DECISION-MAKING IN  COMPLETING THIS ADMISSION.      Chaney Born Brycin Kille DO [facility]  From 7PM - 7AM   03/30/2023, 11:31 PM   ***

## 2023-03-30 NOTE — ED Notes (Signed)
Urine sample was not collected.

## 2023-03-30 NOTE — ED Notes (Signed)
 Pt unable to void at this time. Aware a sample is needed.

## 2023-03-30 NOTE — ED Notes (Signed)
 All triage and hx and questions done by Charlena Cross RN

## 2023-03-31 ENCOUNTER — Encounter (HOSPITAL_COMMUNITY): Payer: Self-pay | Admitting: Family Medicine

## 2023-03-31 ENCOUNTER — Inpatient Hospital Stay (HOSPITAL_COMMUNITY): Payer: No Typology Code available for payment source

## 2023-03-31 DIAGNOSIS — I10 Essential (primary) hypertension: Secondary | ICD-10-CM | POA: Diagnosis present

## 2023-03-31 DIAGNOSIS — E119 Type 2 diabetes mellitus without complications: Secondary | ICD-10-CM

## 2023-03-31 DIAGNOSIS — R112 Nausea with vomiting, unspecified: Secondary | ICD-10-CM | POA: Diagnosis present

## 2023-03-31 DIAGNOSIS — K56609 Unspecified intestinal obstruction, unspecified as to partial versus complete obstruction: Principal | ICD-10-CM | POA: Diagnosis present

## 2023-03-31 DIAGNOSIS — R109 Unspecified abdominal pain: Secondary | ICD-10-CM | POA: Diagnosis present

## 2023-03-31 LAB — CBC WITH DIFFERENTIAL/PLATELET
Abs Immature Granulocytes: 0.03 10*3/uL (ref 0.00–0.07)
Basophils Absolute: 0 10*3/uL (ref 0.0–0.1)
Basophils Relative: 0 %
Eosinophils Absolute: 0 10*3/uL (ref 0.0–0.5)
Eosinophils Relative: 0 %
HCT: 44.3 % (ref 39.0–52.0)
Hemoglobin: 14.3 g/dL (ref 13.0–17.0)
Immature Granulocytes: 1 %
Lymphocytes Relative: 18 %
Lymphs Abs: 1.2 10*3/uL (ref 0.7–4.0)
MCH: 28.9 pg (ref 26.0–34.0)
MCHC: 32.3 g/dL (ref 30.0–36.0)
MCV: 89.7 fL (ref 80.0–100.0)
Monocytes Absolute: 0.5 10*3/uL (ref 0.1–1.0)
Monocytes Relative: 9 %
Neutro Abs: 4.5 10*3/uL (ref 1.7–7.7)
Neutrophils Relative %: 72 %
Platelets: 208 10*3/uL (ref 150–400)
RBC: 4.94 MIL/uL (ref 4.22–5.81)
RDW: 13.5 % (ref 11.5–15.5)
WBC: 6.3 10*3/uL (ref 4.0–10.5)
nRBC: 0 % (ref 0.0–0.2)

## 2023-03-31 LAB — BASIC METABOLIC PANEL
Anion gap: 8 (ref 5–15)
BUN: 17 mg/dL (ref 6–20)
CO2: 30 mmol/L (ref 22–32)
Calcium: 9.6 mg/dL (ref 8.9–10.3)
Chloride: 101 mmol/L (ref 98–111)
Creatinine, Ser: 1.33 mg/dL — ABNORMAL HIGH (ref 0.61–1.24)
GFR, Estimated: >60 mL/min (ref >60)
Glucose, Bld: 163 mg/dL — ABNORMAL HIGH (ref 70–99)
Potassium: 4.5 mmol/L (ref 3.5–5.1)
Sodium: 139 mmol/L (ref 135–145)

## 2023-03-31 LAB — MAGNESIUM: Magnesium: 2 mg/dL (ref 1.7–2.4)

## 2023-03-31 LAB — GLUCOSE, CAPILLARY
Glucose-Capillary: 102 mg/dL — ABNORMAL HIGH (ref 70–99)
Glucose-Capillary: 104 mg/dL — ABNORMAL HIGH (ref 70–99)
Glucose-Capillary: 127 mg/dL — ABNORMAL HIGH (ref 70–99)
Glucose-Capillary: 128 mg/dL — ABNORMAL HIGH (ref 70–99)
Glucose-Capillary: 165 mg/dL — ABNORMAL HIGH (ref 70–99)
Glucose-Capillary: 88 mg/dL (ref 70–99)

## 2023-03-31 MED ORDER — DIATRIZOATE MEGLUMINE & SODIUM 66-10 % PO SOLN
90.0000 mL | Freq: Once | ORAL | Status: AC
  Administered 2023-03-31: 90 mL via NASOGASTRIC
  Filled 2023-03-31: qty 90

## 2023-03-31 NOTE — Inpatient Diabetes Management (Signed)
 Inpatient Diabetes Program Recommendations  AACE/ADA: New Consensus Statement on Inpatient Glycemic Control (2015)  Target Ranges:  Prepandial:   less than 140 mg/dL      Peak postprandial:   less than 180 mg/dL (1-2 hours)      Critically ill patients:  140 - 180 mg/dL   Lab Results  Component Value Date   GLUCAP 127 (H) 03/31/2023   HGBA1C 10.5 (H) 03/30/2023    Review of Glycemic Control  Latest Reference Range & Units 03/30/23 22:26 03/31/23 03:36 03/31/23 07:43 03/31/23 11:53 03/31/23 16:02  Glucose-Capillary 70 - 99 mg/dL 956 (H) 213 (H) 086 (H) 104 (H) 127 (H)   Diabetes history: DM 2 Outpatient Diabetes medications: Januvia 50 mg Daily Current orders for Inpatient glycemic control:  Novolog 0-9 units Q4 hours  A1c 10.5% on 2/25  Spoke with pt at bedside regarding A1c of 10.5% this admission and glucose control at home. Pt reports he checks his glucose daily in the morning and sees his trends mo more than 150's at the most. Encouraged pt to do an additional check later in the day to assess how high his trend go at the end of the day. Pt reports he is active and goes to the gym 3 times a week. Pt follows a correct diet all the time. Discussed with pt monitoring his glucose trends in the hospital to see what his needs were and to reassess tomorrow on 2/27. Will see pt on 2/27 to discuss further.  Thanks,  Christena Deem RN, MSN, BC-ADM Inpatient Diabetes Coordinator Team Pager [phone] (8a-5p)

## 2023-03-31 NOTE — TOC CM/SW Note (Signed)
 Transition of Care [facility]) - Inpatient Brief Assessment   Patient Details  Name: Anthony Mcgrath MRN: [id number] Date of Birth: 07-23-1965  [facility] of [facility] CM/SW Contact:    Harriet Masson, RN Phone Number: 03/31/2023, 3:54 PM   Clinical Narrative:  Patient admitted for SBO. Patient has NG tube.  No TOC needs at this time.  Transition of Care Asessment: Insurance and Status: Insurance coverage has been reviewed Patient has primary care physician: Yes Home environment has been reviewed: safe to discharge home Prior level of function:: independent Prior/Current Home Services: No current home services Social Drivers of Health Review: SDOH reviewed no interventions necessary Readmission risk has been reviewed: Yes Transition of care needs: no transition of care needs at this time

## 2023-03-31 NOTE — Plan of Care (Signed)

## 2023-03-31 NOTE — Progress Notes (Signed)
 [facility] PROGRESS NOTE    Progress Note  Anthony Mcgrath  UJW:[id number] DOB: 1965/11/10 DOA: 03/30/2023 PCP: Patient, No Pcp Per     Brief Narrative:   [name] is an 58 y.o. male past medical history significant for diabetes mellitus type 2, essential hypertension admitted to [facility] on 03/30/2023 for small bowel obstruction  Assessment/Plan:   Small bowel obstruction (HCC) CT scan of the abdomen pelvis was suggestive of small bowel obstruction with transition point with proximity to the mid to repair from prior ventral hernia Placed n.p.o., NG tube was placed. Strict I's and O's Monitor and replete electrolytes as needed try to keep testing greater than 4 magnesium greater than 2. Start a small bowel protocol.  Has not had a bowel movement not passing flatus.  New acute kidney injury: Started on IV fluids recheck basic metabolic panel in the morning Likely hemodynamically deviated due to decreased oral intake.  Uncontrolled diabetes mellitus type 2: With a last hemoglobin A1c of 10.5. Currently n.p.o. continue sliding scale insulin. CBGs every 4.  Essential hypertension: Continue IV hydralazine as needed for systolic blood pressure greater than 180. Blood pressures relatively stable ranging around 144/89.   DVT prophylaxis: lovenoxn Family Communication:none Status is: Inpatient Remains inpatient appropriate because: Small bowel obstruction.    Code Status:     Code Status Orders  (From admission, onward)           Start     Ordered   03/30/23 2300  Full code  Continuous       Question:  By:  Answer:  Consent: discussion documented in EHR   03/30/23 2300           Code Status History     This patient has a current code status but no historical code status.         IV Access:   Peripheral IV   Procedures and diagnostic studies:   DG Abdomen 1 View Result Date: 03/30/2023 CLINICAL DATA:  NG tube placement EXAM: ABDOMEN  - 1 VIEW COMPARISON:  10/13/2017 FINDINGS: Limited field of view for tube placement verification purposes. An enteric tube is present with tip coiled in the left upper quadrant consistent with location in the body of the stomach. Visualized bowel gas pattern is normal. Lung bases are clear. Residual contrast material in the renal collecting systems. IMPRESSION: Enteric tube tip projects over the left upper quadrant consistent with location in the body of the stomach. Electronically Signed   By: Burman Nieves M.D.   On: 03/30/2023 20:16   CT ABDOMEN PELVIS W CONTRAST Result Date: 03/30/2023 CLINICAL DATA:  Epigastric pain. Previous ventral hernia repair. Constipation. EXAM: CT ABDOMEN AND PELVIS WITH CONTRAST TECHNIQUE: Multidetector CT imaging of the abdomen and pelvis was performed using the standard protocol following bolus administration of intravenous contrast. RADIATION DOSE REDUCTION: This exam was performed according to the departmental dose-optimization program which includes automated exposure control, adjustment of the mA and/or kV according to patient size and/or use of iterative reconstruction technique. CONTRAST:  OMNIPAQUE IOHEXOL 300 MG/ML  SOLN COMPARISON:  None Available. FINDINGS: Lower chest: There is some linear opacity lung bases likely scar or atelectasis. No pleural effusion. Hepatobiliary: Mild fatty liver infiltration. No space-occupying liver lesion. Gallbladder is present. Patent portal vein. Pancreas: Unremarkable. No pancreatic ductal dilatation or surrounding inflammatory changes. Spleen: Normal in size without focal abnormality. Adrenals/Urinary Tract: Right adrenal gland is preserved. Left adrenal gland is slightly nodular. Focal area has diameter  16 mm with Hounsfield unit 85 on portal venous phase and 50 on delay, not clearly an adenoma. No enhancing renal mass or collecting system dilatation. The ureters have normal course and caliber extending down to the urinary  bladder. Bladder has a preserved contour. Stomach/Bowel: Large bowel has a normal course and caliber with scattered stool. Scattered diffuse colonic diverticula as well. Normal appendix extends inferior to the cecum in the right lower quadrant. Stomach is distended with fluid and some debris. The duodenum is nondilated. There are some dilated loops of small bowel however in the mid abdomen and upper pelvis. Diameter approaches 4.5 cm. There is adjacent stranding. Small bowel stool appearance identified as well with a transition point seen along the anterior mid abdomen near the level of the umbilicus and the mesh for the previous hernia repair. Please see axial series 301, image 42, coronal series 601, image 123 and sagittal series 602, image 91. Mild soft tissue thickening in this location as well along the anterior abdominal wall. No pneumatosis. No definite free air or portal venous gas. Vascular/Lymphatic: Normal caliber aorta and IVC. Mild scattered vascular calcifications. No discrete abnormal lymph node enlargement present in the abdomen and pelvis. Reproductive: Prostate is unremarkable. Other: Trace free fluid the pelvis. Is also some fluid adjacent to the liver laterally and towards the dome. Musculoskeletal: Scattered degenerative changes of the spine and pelvis. Transitional lumbosacral segment. Areas of significant stenosis along the lower lumbar spine. IMPRESSION: Moderate dilatation of small bowel with areas of adjacent mesenteric fat stranding and small bowel stool appearance. Transition point along the anterior central abdomen near the level of the mesh for prior hernia repair. Developing or partial obstruction. Trace free fluid. No free air Fatty liver infiltration.  Scattered colonic diverticula. Left adrenal nodules indeterminate. Please correlate with any prior to assess for long-term stability or additional confirmatory workup when appropriate with either MRI or washout pre and postcontrast CT  Electronically Signed   By: Karen Kays M.D.   On: 03/30/2023 18:25     Medical Consultants:   None.   Subjective:    Anthony Mcgrath feels better, not passing gas no bowel movements  Objective:    Vitals:   03/30/23 2221 03/31/23 0333 03/31/23 0359 03/31/23 0744  BP: (!) 148/94 (!) 148/89  (!) 144/88  Pulse: (!) 56 66  67  Resp: 18 18  18   Temp: 97.9 F (36.6 C) 98.4 F (36.9 C)  98 F (36.7 C)  TempSrc: Oral Oral    SpO2: 100% 94%  94%  Weight:   105 kg   Height:   6\' 2"  (1.88 m)    SpO2: 94 %   Intake/Output Summary (Last 24 hours) at 03/31/2023 0958 Last data filed at 03/31/2023 0438 Gross per 24 hour  Intake 603.57 ml  Output --  Net 603.57 ml   Filed Weights   03/30/23 1452 03/31/23 0359  Weight: 104.3 kg 105 kg    Exam: General exam: In no acute distress. Respiratory system: Good air movement and clear to auscultation. Cardiovascular system: S1 & S2 heard, RRR. No JVD. Gastrointestinal system: Abdomen is nondistended, soft and nontender.   Extremities: No pedal edema. Skin: No rashes, lesions or ulcers Psychiatry: Judgement and insight appear normal. Mood & affect appropriate.    Data Reviewed:    Labs: Basic Metabolic Panel: Recent Labs  Lab 03/30/23 1456 03/31/23 0522  NA 134* 139  K 4.7 4.5  CL 98 101  CO2 27 30  GLUCOSE  176* 163*  BUN 18 17  CREATININE 0.97 1.33*  CALCIUM 9.6 9.6  MG  --  2.0   GFR Estimated Creatinine Clearance: 77.2 mL/min (A) (by C-G formula based on SCr of 1.33 mg/dL (H)). Liver Function Tests: Recent Labs  Lab 03/30/23 1456  AST 29  ALT 33  ALKPHOS 62  BILITOT 1.4*  PROT 7.4  ALBUMIN 4.3   Recent Labs  Lab 03/30/23 1456  LIPASE 30   No results for input(s): "AMMONIA" in the last 168 hours. Coagulation profile No results for input(s): "INR", "PROTIME" in the last 168 hours. COVID-19 Labs  No results for input(s): "DDIMER", "FERRITIN", "LDH", "CRP" in the last 72 hours.  No results found  for: "SARSCOV2NAA"  CBC: Recent Labs  Lab 03/30/23 1456 03/31/23 0522  WBC 7.0 6.3  NEUTROABS  --  4.5  HGB 14.2 14.3  HCT 44.2 44.3  MCV 88.9 89.7  PLT 205 208   Cardiac Enzymes: No results for input(s): "CKTOTAL", "CKMB", "CKMBINDEX", "TROPONINI" in the last 168 hours. BNP (last 3 results) No results for input(s): "PROBNP" in the last 8760 hours. CBG: Recent Labs  Lab 03/30/23 2226 03/31/23 0336 03/31/23 0743  GLUCAP 122* 165* 128*   D-Dimer: No results for input(s): "DDIMER" in the last 72 hours. Hgb A1c: Recent Labs    03/30/23 2030  HGBA1C 10.5*   Lipid Profile: No results for input(s): "CHOL", "HDL", "LDLCALC", "TRIG", "CHOLHDL", "LDLDIRECT" in the last 72 hours. Thyroid function studies: No results for input(s): "TSH", "T4TOTAL", "T3FREE", "THYROIDAB" in the last 72 hours.  Invalid input(s): "FREET3" Anemia work up: No results for input(s): "VITAMINB12", "FOLATE", "FERRITIN", "TIBC", "IRON", "RETICCTPCT" in the last 72 hours. Sepsis Labs: Recent Labs  Lab 03/30/23 1456 03/31/23 0522  WBC 7.0 6.3   Microbiology No results found for this or any previous visit (from the past 240 hours).   Medications:    insulin aspart  0-9 Units Subcutaneous Q4H   Continuous Infusions:  sodium chloride 100 mL/hr at 03/31/23 0735      LOS: 1 day   Marinda Elk  [facility]  03/31/2023, 9:58 AM

## 2023-03-31 NOTE — Consult Note (Signed)
 Consult Note  Anthony Mcgrath August 20, 1965  [id number].    Requesting MD: Dr. Charm Barges Chief Complaint/Reason for Consult: SBO  HPI:  58 y.o. male with medical history significant for T2DM, HTN who presented to [facility] ED 2/25 with abdominal pain that began that morning. He had associated emesis as well as loose bowel movements that morning but none since. He saw his PCP and had xray done which showed distension. Denies hematemesis, fever, chills. Work up in ED significant for CT concerning for SBO. NGT was placed. Patient transferred to [facility] and admitted to [facility] with general surgery asked to see  Since NGT placement his nausea and abdominal pain has improved. Today he denies flatus. Last BM was yesterday morning. He has 2 similar episodes of symptoms prior last summer but they were less severe and resolved within a few hours.  Substance use: denies Allergies: nkda Blood thinners: none Past Surgeries: ventral hernia repair x2, last in 2017 in [place]   ROS: Reviewed and as above  History reviewed. No pertinent family history.  Past Medical History:  Diagnosis Date   Diabetes mellitus without complication (HCC)    Hypertension     Past Surgical History:  Procedure Laterality Date   VENTRAL HERNIA REPAIR      Social History:  reports that he has never smoked. He has never been exposed to tobacco smoke. He has never used smokeless tobacco. He reports that he does not drink alcohol and does not use drugs.  Allergies: No Known Allergies  Medications Prior to Admission  Medication Sig Dispense Refill   JANUVIA 50 MG tablet Take 50 mg by mouth daily.      Blood pressure (!) 144/88, pulse 67, temperature 98 F (36.7 C), resp. rate 18, height 6\' 2"  (1.88 m), weight 105 kg, SpO2 94%. Physical Exam: General: pleasant, WD, male who is laying in bed in NAD Lungs:  Respiratory effort nonlabored on room air Abd: soft, NT, ND, well healed periumbilical incision. NGT with thin brown  drainage MSK: all 4 extremities are symmetrical with no cyanosis, clubbing, or edema. Skin: warm and dry with no masses, lesions, or rashes Psych: A&Ox3 with an appropriate affect.    Results for orders placed or performed during the hospital encounter of 03/30/23 (from the past 48 hours)  Lipase, blood     Status: None   Collection Time: 03/30/23  2:56 PM  Result Value Ref Range   Lipase 30 11 - 51 U/L    Comment: Performed at [facility], [address] [place], [place] [address]  Comprehensive metabolic panel     Status: Abnormal   Collection Time: 03/30/23  2:56 PM  Result Value Ref Range   Sodium 134 (L) 135 - 145 mmol/L   Potassium 4.7 3.5 - 5.1 mmol/L   Chloride 98 98 - 111 mmol/L   CO2 27 22 - 32 mmol/L   Glucose, Bld 176 (H) 70 - 99 mg/dL    Comment: Glucose reference range applies only to samples taken after fasting for at least 8 hours.   BUN 18 6 - 20 mg/dL   Creatinine, Ser 1.91 0.61 - 1.24 mg/dL   Calcium 9.6 8.9 - 47.8 mg/dL   Total Protein 7.4 6.5 - 8.1 g/dL   Albumin 4.3 3.5 - 5.0 g/dL   AST 29 15 - 41 U/L   ALT 33 0 - 44 U/L   Alkaline Phosphatase 62 38 - 126 U/L  Total Bilirubin 1.4 (H) 0.0 - 1.2 mg/dL   GFR, Estimated >16 >10 mL/min    Comment: (NOTE) Calculated using the CKD-EPI Creatinine Equation (2021)    Anion gap 9 5 - 15    Comment: Performed at [facility], [address] [place], [place] [address]  CBC     Status: None   Collection Time: 03/30/23  2:56 PM  Result Value Ref Range   WBC 7.0 4.0 - 10.5 K/uL   RBC 4.97 4.22 - 5.81 MIL/uL   Hemoglobin 14.2 13.0 - 17.0 g/dL   HCT 40.9 81.1 - 91.4 %   MCV 88.9 80.0 - 100.0 fL   MCH 28.6 26.0 - 34.0 pg   MCHC 32.1 30.0 - 36.0 g/dL   RDW 78.2 95.6 - 21.3 %   Platelets 205 150 - 400 K/uL   nRBC 0.0 0.0 - 0.2 %    Comment: Performed at [facility], [address] [place], [place] [address]  Hemoglobin A1c     Status: Abnormal   Collection Time: 03/30/23  8:30  PM  Result Value Ref Range   Hgb A1c MFr Bld 10.5 (H) 4.8 - 5.6 %    Comment: (NOTE) Pre diabetes:          5.7%-6.4%  Diabetes:              >6.4%  Glycemic control for   <7.0% adults with diabetes    Mean Plasma Glucose 254.65 mg/dL    Comment: Performed at [facility], [address] [place], [place] [address]  Glucose, capillary     Status: Abnormal   Collection Time: 03/30/23 10:26 PM  Result Value Ref Range   Glucose-Capillary 122 (H) 70 - 99 mg/dL    Comment: Glucose reference range applies only to samples taken after fasting for at least 8 hours.  Glucose, capillary     Status: Abnormal   Collection Time: 03/31/23  3:36 AM  Result Value Ref Range   Glucose-Capillary 165 (H) 70 - 99 mg/dL    Comment: Glucose reference range applies only to samples taken after fasting for at least 8 hours.  CBC with Differential/Platelet     Status: None   Collection Time: 03/31/23  5:22 AM  Result Value Ref Range   WBC 6.3 4.0 - 10.5 K/uL   RBC 4.94 4.22 - 5.81 MIL/uL   Hemoglobin 14.3 13.0 - 17.0 g/dL   HCT 29.5 28.4 - 13.2 %   MCV 89.7 80.0 - 100.0 fL   MCH 28.9 26.0 - 34.0 pg   MCHC 32.3 30.0 - 36.0 g/dL   RDW 44.0 10.2 - 72.5 %   Platelets 208 150 - 400 K/uL   nRBC 0.0 0.0 - 0.2 %   Neutrophils Relative % 72 %   Neutro Abs 4.5 1.7 - 7.7 K/uL   Lymphocytes Relative 18 %   Lymphs Abs 1.2 0.7 - 4.0 K/uL   Monocytes Relative 9 %   Monocytes Absolute 0.5 0.1 - 1.0 K/uL   Eosinophils Relative 0 %   Eosinophils Absolute 0.0 0.0 - 0.5 K/uL   Basophils Relative 0 %   Basophils Absolute 0.0 0.0 - 0.1 K/uL   Immature Granulocytes 1 %   Abs Immature Granulocytes 0.03 0.00 - 0.07 K/uL    Comment: Performed at [facility], [address], [place], [place] [address]  Basic metabolic panel     Status: Abnormal   Collection  Time: 03/31/23  5:22 AM  Result Value Ref Range   Sodium 139 135 - 145 mmol/L   Potassium 4.5 3.5 - 5.1 mmol/L   Chloride 101 98 - 111 mmol/L   CO2  30 22 - 32 mmol/L   Glucose, Bld 163 (H) 70 - 99 mg/dL    Comment: Glucose reference range applies only to samples taken after fasting for at least 8 hours.   BUN 17 6 - 20 mg/dL   Creatinine, Ser 2.95 (H) 0.61 - 1.24 mg/dL   Calcium 9.6 8.9 - 62.1 mg/dL   GFR, Estimated >30 >86 mL/min    Comment: (NOTE) Calculated using the CKD-EPI Creatinine Equation (2021)    Anion gap 8 5 - 15    Comment: Performed at [facility], [address], [place], [place] [address]  Magnesium     Status: None   Collection Time: 03/31/23  5:22 AM  Result Value Ref Range   Magnesium 2.0 1.7 - 2.4 mg/dL    Comment: Performed at [facility], [address], [place], [place] [address]  Glucose, capillary     Status: Abnormal   Collection Time: 03/31/23  7:43 AM  Result Value Ref Range   Glucose-Capillary 128 (H) 70 - 99 mg/dL    Comment: Glucose reference range applies only to samples taken after fasting for at least 8 hours.   DG Abdomen 1 View Result Date: 03/30/2023 CLINICAL DATA:  NG tube placement EXAM: ABDOMEN - 1 VIEW COMPARISON:  10/13/2017 FINDINGS: Limited field of view for tube placement verification purposes. An enteric tube is present with tip coiled in the left upper quadrant consistent with location in the body of the stomach. Visualized bowel gas pattern is normal. Lung bases are clear. Residual contrast material in the renal collecting systems. IMPRESSION: Enteric tube tip projects over the left upper quadrant consistent with location in the body of the stomach. Electronically Signed   By: Burman Nieves M.D.   On: 03/30/2023 20:16   CT ABDOMEN PELVIS W CONTRAST Result Date: 03/30/2023 CLINICAL DATA:  Epigastric pain. Previous ventral hernia repair. Constipation. EXAM: CT ABDOMEN AND PELVIS WITH CONTRAST TECHNIQUE: Multidetector CT imaging of the abdomen and pelvis was performed using the standard protocol following bolus administration of intravenous contrast. RADIATION DOSE  REDUCTION: This exam was performed according to the departmental dose-optimization program which includes automated exposure control, adjustment of the mA and/or kV according to patient size and/or use of iterative reconstruction technique. CONTRAST:  OMNIPAQUE IOHEXOL 300 MG/ML  SOLN COMPARISON:  None Available. FINDINGS: Lower chest: There is some linear opacity lung bases likely scar or atelectasis. No pleural effusion. Hepatobiliary: Mild fatty liver infiltration. No space-occupying liver lesion. Gallbladder is present. Patent portal vein. Pancreas: Unremarkable. No pancreatic ductal dilatation or surrounding inflammatory changes. Spleen: Normal in size without focal abnormality. Adrenals/Urinary Tract: Right adrenal gland is preserved. Left adrenal gland is slightly nodular. Focal area has diameter 16 mm with Hounsfield unit 85 on portal venous phase and 50 on delay, not clearly an adenoma. No enhancing renal mass or collecting system dilatation. The ureters have normal course and caliber extending down to the urinary bladder. Bladder has a preserved contour. Stomach/Bowel: Large bowel has a normal course and caliber with scattered stool. Scattered diffuse colonic diverticula as well. Normal appendix extends inferior to the cecum in the right lower quadrant. Stomach is distended with fluid and some debris. The duodenum is nondilated. There are some dilated loops of small  bowel however in the mid abdomen and upper pelvis. Diameter approaches 4.5 cm. There is adjacent stranding. Small bowel stool appearance identified as well with a transition point seen along the anterior mid abdomen near the level of the umbilicus and the mesh for the previous hernia repair. Please see axial series 301, image 42, coronal series 601, image 123 and sagittal series 602, image 91. Mild soft tissue thickening in this location as well along the anterior abdominal wall. No pneumatosis. No definite free air or portal venous gas.  Vascular/Lymphatic: Normal caliber aorta and IVC. Mild scattered vascular calcifications. No discrete abnormal lymph node enlargement present in the abdomen and pelvis. Reproductive: Prostate is unremarkable. Other: Trace free fluid the pelvis. Is also some fluid adjacent to the liver laterally and towards the dome. Musculoskeletal: Scattered degenerative changes of the spine and pelvis. Transitional lumbosacral segment. Areas of significant stenosis along the lower lumbar spine. IMPRESSION: Moderate dilatation of small bowel with areas of adjacent mesenteric fat stranding and small bowel stool appearance. Transition point along the anterior central abdomen near the level of the mesh for prior hernia repair. Developing or partial obstruction. Trace free fluid. No free air Fatty liver infiltration.  Scattered colonic diverticula. Left adrenal nodules indeterminate. Please correlate with any prior to assess for long-term stability or additional confirmatory workup when appropriate with either MRI or washout pre and postcontrast CT Electronically Signed   By: Karen Kays M.D.   On: 03/30/2023 18:25      Assessment/Plan SBO - CT w/ Moderate dilatation of small bowel with areas of adjacent mesenteric fat stranding and small bowel stool appearance. Transition point along the anterior central abdomen near the level of the mesh for prior hernia repair. Developing or partial obstruction. Trace free fluid. No free ai  - No current indication for emergency surgery - NGT in place (no output vol recorded) with thin output in cannister - SBO protocol ordered - Keep K > 4 and Mg > 2 for bowel function - Mobilize for bowel function - Hopefully patient will improve with conservative management. If patient fails to improve with conservative management, he may require exploratory surgery during admission - Agree with medical admission. We will follow with you.   FEN: NPO, NGT LIWS, IVF per primary ID: none  indicated VTE: okay for chemical prophylaxis from surgical standpoint   I reviewed ED provider notes, hospitalist notes, last 24 h vitals and pain scores, last 48 h intake and output, last 24 h labs and trends, and last 24 h imaging results.   Eric Form, [facility] 03/31/2023, 10:39 AM Please see Amion for pager number during day hours 7:00am-4:30pm

## 2023-04-01 DIAGNOSIS — K56609 Unspecified intestinal obstruction, unspecified as to partial versus complete obstruction: Secondary | ICD-10-CM | POA: Diagnosis not present

## 2023-04-01 LAB — BASIC METABOLIC PANEL
Anion gap: 9 (ref 5–15)
BUN: 15 mg/dL (ref 6–20)
CO2: 26 mmol/L (ref 22–32)
Calcium: 9.2 mg/dL (ref 8.9–10.3)
Chloride: 104 mmol/L (ref 98–111)
Creatinine, Ser: 1.3 mg/dL — ABNORMAL HIGH (ref 0.61–1.24)
GFR, Estimated: >60 mL/min (ref >60)
Glucose, Bld: 101 mg/dL — ABNORMAL HIGH (ref 70–99)
Potassium: 4 mmol/L (ref 3.5–5.1)
Sodium: 139 mmol/L (ref 135–145)

## 2023-04-01 LAB — GLUCOSE, CAPILLARY
Glucose-Capillary: 95 mg/dL (ref 70–99)
Glucose-Capillary: 95 mg/dL (ref 70–99)
Glucose-Capillary: 97 mg/dL (ref 70–99)
Glucose-Capillary: 165 mg/dL — ABNORMAL HIGH (ref 70–99)
Glucose-Capillary: 155 mg/dL — ABNORMAL HIGH (ref 70–99)

## 2023-04-01 MED ORDER — SODIUM CHLORIDE 0.9 % IV SOLN: INTRAVENOUS | Status: DC

## 2023-04-01 MED ORDER — INSULIN ASPART 100 UNIT/ML IJ SOLN
0.0000 [IU] | Freq: Three times a day (TID) | INTRAMUSCULAR | Status: DC
  Administered 2023-04-01: 2 [IU] via SUBCUTANEOUS

## 2023-04-01 MED ORDER — INSULIN ASPART 100 UNIT/ML IJ SOLN: 0.0000 [IU] | Freq: Every day | INTRAMUSCULAR | Status: DC

## 2023-04-01 MED ORDER — LINAGLIPTIN 5 MG PO TABS
5.0000 mg | ORAL_TABLET | Freq: Every day | ORAL | Status: DC
  Administered 2023-04-01 – 2023-04-02 (×2): 5 mg via ORAL
  Filled 2023-04-01 (×2): qty 1

## 2023-04-01 MED ORDER — INSULIN ASPART 100 UNIT/ML IJ SOLN
2.0000 [IU] | Freq: Three times a day (TID) | INTRAMUSCULAR | Status: DC
Start: 2023-04-01 — End: 2023-04-02
  Administered 2023-04-01: 2 [IU] via SUBCUTANEOUS

## 2023-04-01 NOTE — Plan of Care (Signed)

## 2023-04-01 NOTE — Progress Notes (Signed)
 [facility] PROGRESS NOTE    Progress Note  Anthony Mcgrath  VHQ:[id number] DOB: [birth year] DOA: 03/30/2023 PCP: [facility]     Brief Narrative:   Anthony Mcgrath is an 58 y.o. male past medical history significant for diabetes mellitus type 2, essential hypertension admitted to [facility] on 03/30/2023 for small bowel obstruction  Assessment/Plan:   Small bowel obstruction (HCC) NG tube came out overnight had a bowel movement. Imaging showed contrast in the colon. General surgery recommended clear liquid diet and advance as tolerated hopefully can go home in 24 hours. Out of bed to chair. For their management per surgery  New acute kidney injury: Unknown baseline, started on IV fluids recheck basic metabolic panel in the morning.  Uncontrolled diabetes mellitus type 2: With a last hemoglobin A1c of 10.5. Starting a full liquid diet start Tradjenta and sliding scale insulin.  Elevated blood pressure without a diagnosis of essential hypertension Continue IV hydralazine as needed for systolic blood pressure greater than 180. Blood pressure is improved this morning ranging around 125/82, continue to monitor. He is on no antihypertensive medications at home   DVT prophylaxis: lovenoxn Family Communication:none Status is: Inpatient Remains inpatient appropriate because: Small bowel obstruction.    Code Status:     Code Status Orders  (From admission, onward)           Start     Ordered   03/30/23 2300  Full code  Continuous       Question:  By:  Answer:  Consent: discussion documented in EHR   03/30/23 2300           Code Status History     This patient has a current code status but no historical code status.         IV Access:   Peripheral IV   Procedures and diagnostic studies:   DG Abd Portable 1V-Small Bowel Obstruction Protocol-initial, 8 hr delay Result Date: 03/31/2023 CLINICAL DATA:  Follow up small bowel obstruction  EXAM: PORTABLE ABDOMEN - 1 VIEW COMPARISON:  Film from earlier in the same day. FINDINGS: Gastric catheter remains in the stomach. Contrast is now seen throughout the colon with mild small bowel dilatation identified. This is consistent with a partial small bowel obstruction. IMPRESSION: Partial small bowel obstruction with contrast within the colon. Electronically Signed   By: Alcide Clever M.D.   On: 03/31/2023 21:37   DG Abd Portable 1V-Small Bowel Protocol-Position Verification Result Date: 03/31/2023 CLINICAL DATA:  NG tube placement EXAM: PORTABLE ABDOMEN - 1 VIEW limited for tube placement COMPARISON:  X-ray and CT 03/30/2023. FINDINGS: Limited portable semi upright view of the upper abdomen demonstrates the enteric tube in place with tip coiling overlying the fundus of the stomach. Elsewhere in the upper abdomen are several dilated loops of air-filled small bowel. Please correlate with prior CT. IMPRESSION: Enteric tube with tip overlying the fundus of the stomach. Electronically Signed   By: Karen Kays M.D.   On: 03/31/2023 13:09   DG Abdomen 1 View Result Date: 03/30/2023 CLINICAL DATA:  NG tube placement EXAM: ABDOMEN - 1 VIEW COMPARISON:  10/13/2017 FINDINGS: Limited field of view for tube placement verification purposes. An enteric tube is present with tip coiled in the left upper quadrant consistent with location in the body of the stomach. Visualized bowel gas pattern is normal. Lung bases are clear. Residual contrast material in the renal collecting systems. IMPRESSION: Enteric tube tip projects over the left upper quadrant consistent with  location in the body of the stomach. Electronically Signed   By: Burman Nieves M.D.   On: 03/30/2023 20:16   CT ABDOMEN PELVIS W CONTRAST Result Date: 03/30/2023 CLINICAL DATA:  Epigastric pain. Previous ventral hernia repair. Constipation. EXAM: CT ABDOMEN AND PELVIS WITH CONTRAST TECHNIQUE: Multidetector CT imaging of the abdomen and pelvis was  performed using the standard protocol following bolus administration of intravenous contrast. RADIATION DOSE REDUCTION: This exam was performed according to the departmental dose-optimization program which includes automated exposure control, adjustment of the mA and/or kV according to patient size and/or use of iterative reconstruction technique. CONTRAST:  OMNIPAQUE IOHEXOL 300 MG/ML  SOLN COMPARISON:  None Available. FINDINGS: Lower chest: There is some linear opacity lung bases likely scar or atelectasis. No pleural effusion. Hepatobiliary: Mild fatty liver infiltration. No space-occupying liver lesion. Gallbladder is present. Patent portal vein. Pancreas: Unremarkable. No pancreatic ductal dilatation or surrounding inflammatory changes. Spleen: Normal in size without focal abnormality. Adrenals/Urinary Tract: Right adrenal gland is preserved. Left adrenal gland is slightly nodular. Focal area has diameter 16 mm with Hounsfield unit 85 on portal venous phase and 50 on delay, not clearly an adenoma. No enhancing renal mass or collecting system dilatation. The ureters have normal course and caliber extending down to the urinary bladder. Bladder has a preserved contour. Stomach/Bowel: Large bowel has a normal course and caliber with scattered stool. Scattered diffuse colonic diverticula as well. Normal appendix extends inferior to the cecum in the right lower quadrant. Stomach is distended with fluid and some debris. The duodenum is nondilated. There are some dilated loops of small bowel however in the mid abdomen and upper pelvis. Diameter approaches 4.5 cm. There is adjacent stranding. Small bowel stool appearance identified as well with a transition point seen along the anterior mid abdomen near the level of the umbilicus and the mesh for the previous hernia repair. Please see axial series 301, image 42, coronal series 601, image 123 and sagittal series 602, image 91. Mild soft tissue thickening in this  location as well along the anterior abdominal wall. No pneumatosis. No definite free air or portal venous gas. Vascular/Lymphatic: Normal caliber aorta and IVC. Mild scattered vascular calcifications. No discrete abnormal lymph node enlargement present in the abdomen and pelvis. Reproductive: Prostate is unremarkable. Other: Trace free fluid the pelvis. Is also some fluid adjacent to the liver laterally and towards the dome. Musculoskeletal: Scattered degenerative changes of the spine and pelvis. Transitional lumbosacral segment. Areas of significant stenosis along the lower lumbar spine. IMPRESSION: Moderate dilatation of small bowel with areas of adjacent mesenteric fat stranding and small bowel stool appearance. Transition point along the anterior central abdomen near the level of the mesh for prior hernia repair. Developing or partial obstruction. Trace free fluid. No free air Fatty liver infiltration.  Scattered colonic diverticula. Left adrenal nodules indeterminate. Please correlate with any prior to assess for long-term stability or additional confirmatory workup when appropriate with either MRI or washout pre and postcontrast CT Electronically Signed   By: Karen Kays M.D.   On: 03/30/2023 18:25     Medical Consultants:   None.   Subjective:    Anthony Mcgrath had a big bowel movement hungry would like to try diet  Objective:    Vitals:   03/31/23 2046 04/01/23 0410 04/01/23 0450 04/01/23 0736  BP: 131/69 116/77  125/82  Pulse: 64 (!) 56  (!) 55  Resp: 18 18    Temp: 98.8 F (37.1 C) 98 F (  36.7 C)  98 F (36.7 C)  TempSrc:      SpO2: 96% 95%  98%  Weight:   116 kg   Height:       SpO2: 98 %   Intake/Output Summary (Last 24 hours) at 04/01/2023 0904 Last data filed at 04/01/2023 0436 Gross per 24 hour  Intake --  Output 500 ml  Net -500 ml   Filed Weights   03/30/23 1452 03/31/23 0359 04/01/23 0450  Weight: 104.3 kg 105 kg 116 kg    Exam: General exam: In no acute  distress. Respiratory system: Good air movement and clear to auscultation. Cardiovascular system: S1 & S2 heard, RRR. No JVD. Gastrointestinal system: Abdomen is nondistended, soft and nontender.  Extremities: No pedal edema. Skin: No rashes, lesions or ulcers Psychiatry: Judgement and insight appear normal. Mood & affect appropriate.   Data Reviewed:    Labs: Basic Metabolic Panel: Recent Labs  Lab 03/30/23 1456 03/31/23 0522 04/01/23 0354  NA 134* 139 139  K 4.7 4.5 4.0  CL 98 101 104  CO2 27 30 26   GLUCOSE 176* 163* 101*  BUN 18 17 15   CREATININE 0.97 1.33* 1.30*  CALCIUM 9.6 9.6 9.2  MG  --  2.0  --    GFR Estimated Creatinine Clearance: 82.8 mL/min (A) (by C-G formula based on SCr of 1.3 mg/dL (H)). Liver Function Tests: Recent Labs  Lab 03/30/23 1456  AST 29  ALT 33  ALKPHOS 62  BILITOT 1.4*  PROT 7.4  ALBUMIN 4.3   Recent Labs  Lab 03/30/23 1456  LIPASE 30   No results for input(s): "AMMONIA" in the last 168 hours. Coagulation profile No results for input(s): "INR", "PROTIME" in the last 168 hours. COVID-19 Labs  No results for input(s): "DDIMER", "FERRITIN", "LDH", "CRP" in the last 72 hours.  No results found for: "SARSCOV2NAA"  CBC: Recent Labs  Lab 03/30/23 1456 03/31/23 0522  WBC 7.0 6.3  NEUTROABS  --  4.5  HGB 14.2 14.3  HCT 44.2 44.3  MCV 88.9 89.7  PLT 205 208   Cardiac Enzymes: No results for input(s): "CKTOTAL", "CKMB", "CKMBINDEX", "TROPONINI" in the last 168 hours. BNP (last 3 results) No results for input(s): "PROBNP" in the last 8760 hours. CBG: Recent Labs  Lab 03/31/23 1602 03/31/23 2034 03/31/23 2346 04/01/23 0414 04/01/23 0736  GLUCAP 127* 102* 88 95 95   D-Dimer: No results for input(s): "DDIMER" in the last 72 hours. Hgb A1c: Recent Labs    03/30/23 2030  HGBA1C 10.5*   Lipid Profile: No results for input(s): "CHOL", "HDL", "LDLCALC", "TRIG", "CHOLHDL", "LDLDIRECT" in the last 72 hours. Thyroid  function studies: No results for input(s): "TSH", "T4TOTAL", "T3FREE", "THYROIDAB" in the last 72 hours.  Invalid input(s): "FREET3" Anemia work up: No results for input(s): "VITAMINB12", "FOLATE", "FERRITIN", "TIBC", "IRON", "RETICCTPCT" in the last 72 hours. Sepsis Labs: Recent Labs  Lab 03/30/23 1456 03/31/23 0522  WBC 7.0 6.3   Microbiology No results found for this or any previous visit (from the past 240 hours).   Medications:    insulin aspart  0-9 Units Subcutaneous Q4H   Continuous Infusions:      LOS: 2 days   Marinda Elk  [facility]  04/01/2023, 9:04 AM

## 2023-04-01 NOTE — Inpatient Diabetes Management (Signed)
 Inpatient Diabetes Program Recommendations  AACE/ADA: New Consensus Statement on Inpatient Glycemic Control (2015)  Target Ranges:  Prepandial:   less than 140 mg/dL      Peak postprandial:   less than 180 mg/dL (1-2 hours)      Critically ill patients:  140 - 180 mg/dL   Lab Results  Component Value Date   GLUCAP 95 04/01/2023   HGBA1C 10.5 (H) 03/30/2023    Review of Glycemic Control  Latest Reference Range & Units 03/31/23 07:43 03/31/23 11:53 03/31/23 16:02 03/31/23 20:34 03/31/23 23:46 04/01/23 04:14 04/01/23 07:36  Glucose-Capillary 70 - 99 mg/dL 478 (H) 295 (H) 621 (H) 102 (H) 88 95 95   Diabetes history: DM 2 Outpatient Diabetes medications: Januvia at home Current orders for Inpatient glycemic control:  Novolog 0-9 units tid + hs  Novolog 2 units tid meal coverage Tradjenta 5 mg Daily  Note: pt needing minimal sliding scale not getting meal coverage yet. Liquid diet ordered. Lets see how trends fluctuate with the addition of a diet as A1c (10.5%) is not consistent with glucose trends inpatient currently.   Spoke with pt at bedside reviewing glucose trends. At least encouraged follow up with repeat A1c within 3 months with his PCP.  Thanks,  Christena Deem RN, MSN, BC-ADM Inpatient Diabetes Coordinator Team Pager [phone] (8a-5p)

## 2023-04-01 NOTE — Progress Notes (Signed)
 Subjective/Chief Complaint: NG came out last night and nursing had difficulty trying to replace to aborted.  NG previously had 1300 cc out. Since, patient had had a very large BM and feels back to baseline with no pain or nausea   Objective: Vital signs in last 24 hours: Temp:  [98 F (36.7 C)-98.8 F (37.1 C)] 98 F (36.7 C) (02/27 0736) Pulse Rate:  [55-64] 55 (02/27 0736) Resp:  [18] 18 (02/27 0410) BP: (116-140)/(69-92) 125/82 (02/27 0736) SpO2:  [95 %-98 %] 98 % (02/27 0736) Weight:  [528 kg] 116 kg (02/27 0450) Last BM Date : 03/30/23  Intake/Output from previous day: 02/26 0701 - 02/27 0700 In: -  Out: 1300 [Emesis/NG output:1300] Intake/Output this shift: No intake/output data recorded.  Exam: Awake and alert Looks comfortable Abdomen soft, non-tender, non-distended  Lab Results:  Recent Labs    03/30/23 1456 03/31/23 0522  WBC 7.0 6.3  HGB 14.2 14.3  HCT 44.2 44.3  PLT 205 208   BMET Recent Labs    03/31/23 0522 04/01/23 0354  NA 139 139  K 4.5 4.0  CL 101 104  CO2 30 26  GLUCOSE 163* 101*  BUN 17 15  CREATININE 1.33* 1.30*  CALCIUM 9.6 9.2   PT/INR No results for input(s): "LABPROT", "INR" in the last 72 hours. ABG No results for input(s): "PHART", "HCO3" in the last 72 hours.  Invalid input(s): "PCO2", "PO2"  Studies/Results: DG Abd Portable 1V-Small Bowel Obstruction Protocol-initial, 8 hr delay Result Date: 03/31/2023 CLINICAL DATA:  Follow up small bowel obstruction EXAM: PORTABLE ABDOMEN - 1 VIEW COMPARISON:  Film from earlier in the same day. FINDINGS: Gastric catheter remains in the stomach. Contrast is now seen throughout the colon with mild small bowel dilatation identified. This is consistent with a partial small bowel obstruction. IMPRESSION: Partial small bowel obstruction with contrast within the colon. Electronically Signed   By: Alcide Clever M.D.   On: 03/31/2023 21:37   DG Abd Portable 1V-Small Bowel Protocol-Position  Verification Result Date: 03/31/2023 CLINICAL DATA:  NG tube placement EXAM: PORTABLE ABDOMEN - 1 VIEW limited for tube placement COMPARISON:  X-ray and CT 03/30/2023. FINDINGS: Limited portable semi upright view of the upper abdomen demonstrates the enteric tube in place with tip coiling overlying the fundus of the stomach. Elsewhere in the upper abdomen are several dilated loops of air-filled small bowel. Please correlate with prior CT. IMPRESSION: Enteric tube with tip overlying the fundus of the stomach. Electronically Signed   By: Karen Kays M.D.   On: 03/31/2023 13:09   DG Abdomen 1 View Result Date: 03/30/2023 CLINICAL DATA:  NG tube placement EXAM: ABDOMEN - 1 VIEW COMPARISON:  10/13/2017 FINDINGS: Limited field of view for tube placement verification purposes. An enteric tube is present with tip coiled in the left upper quadrant consistent with location in the body of the stomach. Visualized bowel gas pattern is normal. Lung bases are clear. Residual contrast material in the renal collecting systems. IMPRESSION: Enteric tube tip projects over the left upper quadrant consistent with location in the body of the stomach. Electronically Signed   By: Burman Nieves M.D.   On: 03/30/2023 20:16   CT ABDOMEN PELVIS W CONTRAST Result Date: 03/30/2023 CLINICAL DATA:  Epigastric pain. Previous ventral hernia repair. Constipation. EXAM: CT ABDOMEN AND PELVIS WITH CONTRAST TECHNIQUE: Multidetector CT imaging of the abdomen and pelvis was performed using the standard protocol following bolus administration of intravenous contrast. RADIATION DOSE REDUCTION: This exam was performed  according to the departmental dose-optimization program which includes automated exposure control, adjustment of the mA and/or kV according to patient size and/or use of iterative reconstruction technique. CONTRAST:  OMNIPAQUE IOHEXOL 300 MG/ML  SOLN COMPARISON:  None Available. FINDINGS: Lower chest: There is some linear  opacity lung bases likely scar or atelectasis. No pleural effusion. Hepatobiliary: Mild fatty liver infiltration. No space-occupying liver lesion. Gallbladder is present. Patent portal vein. Pancreas: Unremarkable. No pancreatic ductal dilatation or surrounding inflammatory changes. Spleen: Normal in size without focal abnormality. Adrenals/Urinary Tract: Right adrenal gland is preserved. Left adrenal gland is slightly nodular. Focal area has diameter 16 mm with Hounsfield unit 85 on portal venous phase and 50 on delay, not clearly an adenoma. No enhancing renal mass or collecting system dilatation. The ureters have normal course and caliber extending down to the urinary bladder. Bladder has a preserved contour. Stomach/Bowel: Large bowel has a normal course and caliber with scattered stool. Scattered diffuse colonic diverticula as well. Normal appendix extends inferior to the cecum in the right lower quadrant. Stomach is distended with fluid and some debris. The duodenum is nondilated. There are some dilated loops of small bowel however in the mid abdomen and upper pelvis. Diameter approaches 4.5 cm. There is adjacent stranding. Small bowel stool appearance identified as well with a transition point seen along the anterior mid abdomen near the level of the umbilicus and the mesh for the previous hernia repair. Please see axial series 301, image 42, coronal series 601, image 123 and sagittal series 602, image 91. Mild soft tissue thickening in this location as well along the anterior abdominal wall. No pneumatosis. No definite free air or portal venous gas. Vascular/Lymphatic: Normal caliber aorta and IVC. Mild scattered vascular calcifications. No discrete abnormal lymph node enlargement present in the abdomen and pelvis. Reproductive: Prostate is unremarkable. Other: Trace free fluid the pelvis. Is also some fluid adjacent to the liver laterally and towards the dome. Musculoskeletal: Scattered degenerative changes  of the spine and pelvis. Transitional lumbosacral segment. Areas of significant stenosis along the lower lumbar spine. IMPRESSION: Moderate dilatation of small bowel with areas of adjacent mesenteric fat stranding and small bowel stool appearance. Transition point along the anterior central abdomen near the level of the mesh for prior hernia repair. Developing or partial obstruction. Trace free fluid. No free air Fatty liver infiltration.  Scattered colonic diverticula. Left adrenal nodules indeterminate. Please correlate with any prior to assess for long-term stability or additional confirmatory workup when appropriate with either MRI or washout pre and postcontrast CT Electronically Signed   By: Karen Kays M.D.   On: 03/30/2023 18:25    Anti-infectives: Anti-infectives (From admission, onward)    None       Assessment/Plan: SBO  -small bowel protocol shows contrast through out the colon with some still in the small bowel which is minimally dilated consistent with possible partial obstruction.  Clinically, the patient looks well.  -Will start clear liquids and advance as tolerated.  He may be able to go later this afternoon vs the morning if tolerating po    Abigail Miyamoto 04/01/2023

## 2023-04-02 ENCOUNTER — Other Ambulatory Visit (HOSPITAL_COMMUNITY): Payer: Self-pay

## 2023-04-02 DIAGNOSIS — K56609 Unspecified intestinal obstruction, unspecified as to partial versus complete obstruction: Secondary | ICD-10-CM | POA: Diagnosis not present

## 2023-04-02 LAB — BASIC METABOLIC PANEL
Anion gap: 9 (ref 5–15)
BUN: 10 mg/dL (ref 6–20)
CO2: 27 mmol/L (ref 22–32)
Calcium: 9.2 mg/dL (ref 8.9–10.3)
Chloride: 103 mmol/L (ref 98–111)
Creatinine, Ser: 1.21 mg/dL (ref 0.61–1.24)
GFR, Estimated: >60 mL/min (ref >60)
Glucose, Bld: 97 mg/dL (ref 70–99)
Potassium: 3.8 mmol/L (ref 3.5–5.1)
Sodium: 139 mmol/L (ref 135–145)

## 2023-04-02 LAB — GLUCOSE, CAPILLARY: Glucose-Capillary: 85 mg/dL (ref 70–99)

## 2023-04-02 MED ORDER — JANUVIA 50 MG PO TABS
50.0000 mg | ORAL_TABLET | Freq: Every day | ORAL | 1 refills | Status: AC
  Filled 2023-04-02 (×2): qty 30, 30d supply, fill #0

## 2023-04-02 MED ORDER — METFORMIN HCL 1000 MG PO TABS
500.0000 mg | ORAL_TABLET | Freq: Two times a day (BID) | ORAL | 2 refills | Status: AC
  Filled 2023-04-02: qty 30, 30d supply, fill #0

## 2023-04-02 NOTE — Plan of Care (Signed)
  Problem: Education: Goal: Ability to describe self-care measures that may prevent or decrease complications (Diabetes Survival Skills Education) will improve Outcome: Progressing Goal: Individualized Educational Video(s) Outcome: Progressing   Problem: Coping: Goal: Ability to adjust to condition or change in health will improve Outcome: Progressing   Problem: Fluid Volume: Goal: Ability to maintain a balanced intake and output will improve Outcome: Progressing   Problem: Health Behavior/Discharge Planning: Goal: Ability to identify and utilize available resources and services will improve Outcome: Progressing Goal: Ability to manage health-related needs will improve Outcome: Progressing   Problem: Metabolic: Goal: Ability to maintain appropriate glucose levels will improve Outcome: Progressing   Problem: Nutritional: Goal: Maintenance of adequate nutrition will improve Outcome: Progressing Patient tolerating current diet. Goal: Progress toward achieving an optimal weight will improve Outcome: Progressing   Problem: Skin Integrity: Goal: Risk for impaired skin integrity will decrease Outcome: Progressing   Problem: Tissue Perfusion: Goal: Adequacy of tissue perfusion will improve Outcome: Progressing   Problem: Education: Goal: Knowledge of General Education information will improve Description: Including pain rating scale, medication(s)/side effects and non-pharmacologic comfort measures Outcome: Progressing   Problem: Health Behavior/Discharge Planning: Goal: Ability to manage health-related needs will improve Outcome: Progressing   Problem: Clinical Measurements: Goal: Ability to maintain clinical measurements within normal limits will improve Outcome: Progressing Goal: Will remain free from infection Outcome: Progressing Goal: Diagnostic test results will improve Outcome: Progressing Goal: Respiratory complications will improve Outcome: Progressing Goal:  Cardiovascular complication will be avoided Outcome: Progressing   Problem: Activity: Goal: Risk for activity intolerance will decrease Outcome: Progressing   Problem: Nutrition: Goal: Adequate nutrition will be maintained Outcome: Progressing     Patient has been tolerating current diet Full liquid diet. Denies any nausea and no vomiting all night.   Problem: Coping: Goal: Level of anxiety will decrease Outcome: Progressing   Problem: Elimination: Goal: Will not experience complications related to bowel motility Outcome: Progressing Goal: Will not experience complications related to urinary retention Outcome: Progressing   Problem: Pain Managment: Goal: General experience of comfort will improve and/or be controlled Outcome: Progressing Patient denies any pain all night.   Problem: Safety: Goal: Ability to remain free from injury will improve Outcome: Progressing   Problem: Skin Integrity: Goal: Risk for impaired skin integrity will decrease Outcome: Progressing

## 2023-04-02 NOTE — Progress Notes (Signed)
   Subjective: Patient passing flatus and says he had multiple bowel movements yesterday. Tolerated FLD without nausea or emesis. He is frustrated that he was not discharged yesterday.   Objective: Vital signs in last 24 hours: Temp:  [97.6 F (36.4 C)-98.4 F (36.9 C)] 98.4 F (36.9 C) (02/28 0708) Pulse Rate:  [51-61] 54 (02/28 0708) Resp:  [16-17] 16 (02/28 0708) BP: (115-148)/(75-92) 139/87 (02/28 0708) SpO2:  [96 %-99 %] 97 % (02/28 0708) Weight:  [115.7 kg] 115.7 kg (02/28 0500) Last BM Date : 04/01/23  Intake/Output from previous day: 02/27 0701 - 02/28 0700 In: 1643 [P.O.:420; I.V.:1223] Out: -  Intake/Output this shift: No intake/output data recorded.  Exam: Awake and alert Looks comfortable Abdomen soft, non-tender, non-distended  Lab Results:  I personally reviewed all labs for past 24h  Studies/Results: No new imaging  Anti-infectives: Anti-infectives (From admission, onward)    None       Assessment/Plan: SBO  - Advanced to carb controlled diet - Okay to discharge from surgical perspective since tolerating PO and having bowel function - Surgery will sign off at this time. Please reach back out if any new questions or concerns arise.   This required a straight forward level of medical decision making.  Donata Duff, MD [facility] Surgery

## 2023-04-02 NOTE — Discharge Summary (Signed)
 Physician Discharge Summary  Anthony Mcgrath ZOX:[id number] DOB: [birth year] DOA: 03/30/2023  PCP: [facility]  Admit date: 03/30/2023 Discharge date: 04/02/2023  Admitted From: Home Disposition:  home  Recommendations for Outpatient Follow-up:  Follow up with PCP in 1-2 weeks Please obtain BMP/CBC in one week Titrate oral hypoglycemic agents as needed.  Home Health:no Equipment/Devices:None  Discharge Condition:Stable CODE STATUS:Full Diet recommendation: Heart Healthy   Brief/Interim Summary: 58 y.o. male past medical history significant for diabetes mellitus type 2, essential hypertension admitted to [facility] on 03/30/2023 for small bowel obstruction   Discharge Diagnoses:  Principal Problem:   Small bowel obstruction (HCC) Active Problems:   Abdominal pain   Nausea & vomiting   DM2 (diabetes mellitus, type 2) (HCC)   Essential hypertension  Small bowel obstruction: Was treated conservatively General Surgery was consulted. Within 24 hours he was able to eat. Was having bowel movements. Will go back on regular diet.  Chronic kidney disease stage IIIa No known baseline, his creatinine remained stable.  Uncontrolled diabetes mellitus type 2: With last A1c of 10.5, patient is noncompliant with his medication. He was resumed on his Januvia and metformin low-dose was added. Follow-up PCP and titrate oral hypoglycemic agents as needed.  Elevated blood pressure without a diagnosis of hypertension: Blood pressure has been ranging anywhere from 116/77 to 148/92. Will follow-up PCP as an outpatient.  Discharge Instructions  Discharge Instructions     Diet - low sodium heart healthy   Complete by: As directed    Increase activity slowly   Complete by: As directed       Allergies as of 04/02/2023   No Known Allergies      Medication List     TAKE these medications    Januvia 50 MG tablet Generic drug: sitaGLIPtin Take 1 tablet (50 mg total)  by mouth daily.   metFORMIN 1000 MG tablet Commonly known as: GLUCOPHAGE Take 0.5 tablets (500 mg total) by mouth 2 (two) times daily with a meal.        No Known Allergies  Consultations: General surgery   Procedures/Studies: DG Abd Portable 1V-Small Bowel Obstruction Protocol-initial, 8 hr delay Result Date: 03/31/2023 CLINICAL DATA:  Follow up small bowel obstruction EXAM: PORTABLE ABDOMEN - 1 VIEW COMPARISON:  Film from earlier in the same day. FINDINGS: Gastric catheter remains in the stomach. Contrast is now seen throughout the colon with mild small bowel dilatation identified. This is consistent with a partial small bowel obstruction. IMPRESSION: Partial small bowel obstruction with contrast within the colon. Electronically Signed   By: Alcide Clever M.D.   On: 03/31/2023 21:37   DG Abd Portable 1V-Small Bowel Protocol-Position Verification Result Date: 03/31/2023 CLINICAL DATA:  NG tube placement EXAM: PORTABLE ABDOMEN - 1 VIEW limited for tube placement COMPARISON:  X-ray and CT 03/30/2023. FINDINGS: Limited portable semi upright view of the upper abdomen demonstrates the enteric tube in place with tip coiling overlying the fundus of the stomach. Elsewhere in the upper abdomen are several dilated loops of air-filled small bowel. Please correlate with prior CT. IMPRESSION: Enteric tube with tip overlying the fundus of the stomach. Electronically Signed   By: Karen Kays M.D.   On: 03/31/2023 13:09   DG Abdomen 1 View Result Date: 03/30/2023 CLINICAL DATA:  NG tube placement EXAM: ABDOMEN - 1 VIEW COMPARISON:  10/13/2017 FINDINGS: Limited field of view for tube placement verification purposes. An enteric tube is present with tip coiled in the left upper quadrant consistent  with location in the body of the stomach. Visualized bowel gas pattern is normal. Lung bases are clear. Residual contrast material in the renal collecting systems. IMPRESSION: Enteric tube tip projects over the left  upper quadrant consistent with location in the body of the stomach. Electronically Signed   By: Burman Nieves M.D.   On: 03/30/2023 20:16   CT ABDOMEN PELVIS W CONTRAST Result Date: 03/30/2023 CLINICAL DATA:  Epigastric pain. Previous ventral hernia repair. Constipation. EXAM: CT ABDOMEN AND PELVIS WITH CONTRAST TECHNIQUE: Multidetector CT imaging of the abdomen and pelvis was performed using the standard protocol following bolus administration of intravenous contrast. RADIATION DOSE REDUCTION: This exam was performed according to the departmental dose-optimization program which includes automated exposure control, adjustment of the mA and/or kV according to patient size and/or use of iterative reconstruction technique. CONTRAST:  OMNIPAQUE IOHEXOL 300 MG/ML  SOLN COMPARISON:  None Available. FINDINGS: Lower chest: There is some linear opacity lung bases likely scar or atelectasis. No pleural effusion. Hepatobiliary: Mild fatty liver infiltration. No space-occupying liver lesion. Gallbladder is present. Patent portal vein. Pancreas: Unremarkable. No pancreatic ductal dilatation or surrounding inflammatory changes. Spleen: Normal in size without focal abnormality. Adrenals/Urinary Tract: Right adrenal gland is preserved. Left adrenal gland is slightly nodular. Focal area has diameter 16 mm with Hounsfield unit 85 on portal venous phase and 50 on delay, not clearly an adenoma. No enhancing renal mass or collecting system dilatation. The ureters have normal course and caliber extending down to the urinary bladder. Bladder has a preserved contour. Stomach/Bowel: Large bowel has a normal course and caliber with scattered stool. Scattered diffuse colonic diverticula as well. Normal appendix extends inferior to the cecum in the right lower quadrant. Stomach is distended with fluid and some debris. The duodenum is nondilated. There are some dilated loops of small bowel however in the mid abdomen and upper  pelvis. Diameter approaches 4.5 cm. There is adjacent stranding. Small bowel stool appearance identified as well with a transition point seen along the anterior mid abdomen near the level of the umbilicus and the mesh for the previous hernia repair. Please see axial series 301, image 42, coronal series 601, image 123 and sagittal series 602, image 91. Mild soft tissue thickening in this location as well along the anterior abdominal wall. No pneumatosis. No definite free air or portal venous gas. Vascular/Lymphatic: Normal caliber aorta and IVC. Mild scattered vascular calcifications. No discrete abnormal lymph node enlargement present in the abdomen and pelvis. Reproductive: Prostate is unremarkable. Other: Trace free fluid the pelvis. Is also some fluid adjacent to the liver laterally and towards the dome. Musculoskeletal: Scattered degenerative changes of the spine and pelvis. Transitional lumbosacral segment. Areas of significant stenosis along the lower lumbar spine. IMPRESSION: Moderate dilatation of small bowel with areas of adjacent mesenteric fat stranding and small bowel stool appearance. Transition point along the anterior central abdomen near the level of the mesh for prior hernia repair. Developing or partial obstruction. Trace free fluid. No free air Fatty liver infiltration.  Scattered colonic diverticula. Left adrenal nodules indeterminate. Please correlate with any prior to assess for long-term stability or additional confirmatory workup when appropriate with either MRI or washout pre and postcontrast CT Electronically Signed   By: Karen Kays M.D.   On: 03/30/2023 18:25     Subjective: No gum pain feels great wants to go home  Discharge Exam: Vitals:   04/02/23 0537 04/02/23 0708  BP: (!) 148/92 139/87  Pulse: (!) 51 Marland Kitchen)  54  Resp: 17 16  Temp: 97.6 F (36.4 C) 98.4 F (36.9 C)  SpO2: 99% 97%   Vitals:   04/01/23 1951 04/02/23 0500 04/02/23 0537 04/02/23 0708  BP: 115/75  (!)  148/92 139/87  Pulse: (!) 55  (!) 51 (!) 54  Resp: 16  17 16   Temp: 98.1 F (36.7 C)  97.6 F (36.4 C) 98.4 F (36.9 C)  TempSrc: Oral  Oral   SpO2: 98%  99% 97%  Weight:  115.7 kg    Height:        General: Pt is alert, awake, not in acute distress Cardiovascular: RRR, S1/S2 +, no rubs, no gallops Respiratory: CTA bilaterally, no wheezing, no rhonchi Abdominal: Soft, NT, ND, bowel sounds + Extremities: no edema, no cyanosis    The results of significant diagnostics from this hospitalization (including imaging, microbiology, ancillary and laboratory) are listed below for reference.     Microbiology: No results found for this or any previous visit (from the past 240 hours).   Labs: BNP (last 3 results) No results for input(s): "BNP" in the last 8760 hours. Basic Metabolic Panel: Recent Labs  Lab 03/30/23 1456 03/31/23 0522 04/01/23 0354  NA 134* 139 139  K 4.7 4.5 4.0  CL 98 101 104  CO2 27 30 26   GLUCOSE 176* 163* 101*  BUN 18 17 15   CREATININE 0.97 1.33* 1.30*  CALCIUM 9.6 9.6 9.2  MG  --  2.0  --    Liver Function Tests: Recent Labs  Lab 03/30/23 1456  AST 29  ALT 33  ALKPHOS 62  BILITOT 1.4*  PROT 7.4  ALBUMIN 4.3   Recent Labs  Lab 03/30/23 1456  LIPASE 30   No results for input(s): "AMMONIA" in the last 168 hours. CBC: Recent Labs  Lab 03/30/23 1456 03/31/23 0522  WBC 7.0 6.3  NEUTROABS  --  4.5  HGB 14.2 14.3  HCT 44.2 44.3  MCV 88.9 89.7  PLT 205 208   Cardiac Enzymes: No results for input(s): "CKTOTAL", "CKMB", "CKMBINDEX", "TROPONINI" in the last 168 hours. BNP: Invalid input(s): "POCBNP" CBG: Recent Labs  Lab 04/01/23 0736 04/01/23 1212 04/01/23 1607 04/01/23 1950 04/02/23 0710  GLUCAP 95 97 165* 155* 85   D-Dimer No results for input(s): "DDIMER" in the last 72 hours. Hgb A1c Recent Labs    03/30/23 2030  HGBA1C 10.5*   Lipid Profile No results for input(s): "CHOL", "HDL", "LDLCALC", "TRIG", "CHOLHDL",  "LDLDIRECT" in the last 72 hours. Thyroid function studies No results for input(s): "TSH", "T4TOTAL", "T3FREE", "THYROIDAB" in the last 72 hours.  Invalid input(s): "FREET3" Anemia work up No results for input(s): "VITAMINB12", "FOLATE", "FERRITIN", "TIBC", "IRON", "RETICCTPCT" in the last 72 hours. Urinalysis No results found for: "COLORURINE", "APPEARANCEUR", "LABSPEC", "PHURINE", "GLUCOSEU", "HGBUR", "BILIRUBINUR", "KETONESUR", "PROTEINUR", "UROBILINOGEN", "NITRITE", "LEUKOCYTESUR" Sepsis Labs Recent Labs  Lab 03/30/23 1456 03/31/23 0522  WBC 7.0 6.3   Microbiology No results found for this or any previous visit (from the past 240 hours).   Time coordinating discharge: Over 35 minutes  SIGNED:   Marinda Elk, MD  [facility] 04/02/2023, 8:28 AM Pager   If 7PM-7AM, please contact night-coverage [email or web address] Password [email or web address]
